# Patient Record
Sex: Male | Born: 1975 | Race: White | Hispanic: No | Marital: Married | Smoking: Never smoker
Health system: Southern US, Community
[De-identification: ages and names within clinical notes are randomized; demographics above are authoritative.]

## PROBLEM LIST (undated history)

## (undated) DIAGNOSIS — M25569 Pain in unspecified knee: Secondary | ICD-10-CM

## (undated) DIAGNOSIS — G4730 Sleep apnea, unspecified: Secondary | ICD-10-CM

## (undated) DIAGNOSIS — M2550 Pain in unspecified joint: Secondary | ICD-10-CM

## (undated) DIAGNOSIS — F32A Depression, unspecified: Secondary | ICD-10-CM

## (undated) DIAGNOSIS — F319 Bipolar disorder, unspecified: Secondary | ICD-10-CM

## (undated) DIAGNOSIS — E7800 Pure hypercholesterolemia, unspecified: Secondary | ICD-10-CM

## (undated) DIAGNOSIS — F329 Major depressive disorder, single episode, unspecified: Secondary | ICD-10-CM

## (undated) DIAGNOSIS — I10 Essential (primary) hypertension: Secondary | ICD-10-CM

## (undated) DIAGNOSIS — K5900 Constipation, unspecified: Secondary | ICD-10-CM

## (undated) HISTORY — PX: ADENOIDECTOMY: SUR15

## (undated) HISTORY — DX: Bipolar disorder, unspecified: F31.9

## (undated) HISTORY — DX: Sleep apnea, unspecified: G47.30

## (undated) HISTORY — DX: Constipation, unspecified: K59.00

## (undated) HISTORY — DX: Pain in unspecified joint: M25.50

## (undated) HISTORY — DX: Major depressive disorder, single episode, unspecified: F32.9

## (undated) HISTORY — DX: Depression, unspecified: F32.A

## (undated) HISTORY — DX: Pain in unspecified knee: M25.569

## (undated) HISTORY — PX: KNEE SURGERY: SHX244

---

## 2014-05-06 LAB — HEPATIC FUNCTION PANEL
ALT: 16 U/L (ref 10–40)
AST: 15 U/L (ref 14–40)
Alkaline Phosphatase: 28 U/L (ref 25–125)
BILIRUBIN, TOTAL: 0.5 mg/dL

## 2014-05-06 LAB — BASIC METABOLIC PANEL
BUN: 12 mg/dL (ref 4–21)
Creatinine: 0.8 mg/dL (ref <=1.3)
Glucose: 87 mg/dL
Potassium: 4.1 mmol/L (ref 3.4–5.3)
SODIUM: 140 mmol/L (ref 137–147)

## 2014-05-06 LAB — LIPID PANEL
Cholesterol: 165 mg/dL (ref 0–200)
HDL: 43 mg/dL (ref 35–70)
LDL CALC: 105 mg/dL
TRIGLYCERIDES: 87 mg/dL (ref 40–160)

## 2014-05-06 LAB — CBC AND DIFFERENTIAL
HEMATOCRIT: 41 % (ref 41–53)
HEMOGLOBIN: 14.2 g/dL (ref 13.5–17.5)
Platelets: 277 10*3/uL (ref 150–399)
WBC: 4.5 10^3/mL

## 2014-11-11 LAB — HEPATIC FUNCTION PANEL
ALT: 19 U/L (ref 10–40)
AST: 16 U/L (ref 14–40)
Alkaline Phosphatase: 28 U/L (ref 25–125)
Bilirubin, Total: 0.3 mg/dL

## 2014-11-11 LAB — LIPID PANEL
Cholesterol: 151 mg/dL (ref 0–200)
HDL: 41 mg/dL (ref 35–70)
LDL Cholesterol: 87 mg/dL
Triglycerides: 112 mg/dL (ref 40–160)

## 2014-11-11 LAB — BASIC METABOLIC PANEL
BUN: 11 mg/dL (ref 4–21)
CREATININE: 0.9 mg/dL (ref <=1.3)
GLUCOSE: 95 mg/dL
POTASSIUM: 4.3 mmol/L (ref 3.4–5.3)
Sodium: 141 mmol/L (ref 137–147)

## 2015-08-25 LAB — LIPID PANEL
Cholesterol: 169 mg/dL (ref 0–200)
HDL: 45 mg/dL (ref 35–70)
LDL Cholesterol: 93 mg/dL
TRIGLYCERIDES: 156 mg/dL (ref 40–160)

## 2015-08-25 LAB — HEPATIC FUNCTION PANEL
ALT: 17 U/L (ref 10–40)
AST: 15 U/L (ref 14–40)
Alkaline Phosphatase: 33 U/L (ref 25–125)
BILIRUBIN, TOTAL: 0.3 mg/dL

## 2015-08-25 LAB — CBC AND DIFFERENTIAL
HCT: 40 % — AB (ref 41–53)
Hemoglobin: 14 g/dL (ref 13.5–17.5)
Platelets: 303 10*3/uL (ref 150–399)
WBC: 5.6 10^3/mL

## 2015-08-25 LAB — BASIC METABOLIC PANEL
BUN: 11 mg/dL (ref 4–21)
CREATININE: 1.1 mg/dL (ref <=1.3)
Glucose: 97 mg/dL
Potassium: 4.1 mmol/L (ref 3.4–5.3)
SODIUM: 142 mmol/L (ref 137–147)

## 2015-12-11 DIAGNOSIS — F3181 Bipolar II disorder: Secondary | ICD-10-CM | POA: Diagnosis not present

## 2015-12-11 DIAGNOSIS — F900 Attention-deficit hyperactivity disorder, predominantly inattentive type: Secondary | ICD-10-CM | POA: Diagnosis not present

## 2015-12-11 DIAGNOSIS — F408 Other phobic anxiety disorders: Secondary | ICD-10-CM | POA: Diagnosis not present

## 2016-03-05 DIAGNOSIS — F408 Other phobic anxiety disorders: Secondary | ICD-10-CM | POA: Diagnosis not present

## 2016-03-05 DIAGNOSIS — F900 Attention-deficit hyperactivity disorder, predominantly inattentive type: Secondary | ICD-10-CM | POA: Diagnosis not present

## 2016-03-05 DIAGNOSIS — F3181 Bipolar II disorder: Secondary | ICD-10-CM | POA: Diagnosis not present

## 2016-06-11 ENCOUNTER — Ambulatory Visit (HOSPITAL_COMMUNITY)
Admission: EM | Admit: 2016-06-11 | Discharge: 2016-06-11 | Disposition: A | Discharge status: Home / Self Care | Payer: BLUE CROSS/BLUE SHIELD | Attending: Family Medicine | Admitting: Family Medicine

## 2016-06-11 ENCOUNTER — Encounter (HOSPITAL_COMMUNITY): Payer: Self-pay | Admitting: *Deleted

## 2016-06-11 DIAGNOSIS — L03032 Cellulitis of left toe: Secondary | ICD-10-CM

## 2016-06-11 HISTORY — DX: Essential (primary) hypertension: I10

## 2016-06-11 HISTORY — DX: Pure hypercholesterolemia, unspecified: E78.00

## 2016-06-11 MED ORDER — DOXYCYCLINE HYCLATE 100 MG PO TABS: 100.0000 mg | ORAL_TABLET | Freq: Two times a day (BID) | ORAL | 0 refills | Status: DC

## 2016-06-11 NOTE — ED Triage Notes (Signed)
Pain  l  Big  Toe  X  1  Week pus  X   3  Days       Pus  And  Redness

## 2016-06-11 NOTE — ED Provider Notes (Signed)
[facility]    CSN: [id number] Arrival date & time: 06/11/16  1004     History   Chief Complaint Chief Complaint  Patient presents with  . Toe Pain    HPI Corey Martin is a 40 y.o. male.   This is a 40 year old gentleman who was removing some tissue from his left great toe last weekend (5 days ago) after which he developed a large amount of pus and swelling and pain. There's been no discharge. Patient has not had this happen to him before.  Patient works as a Optician, dispensingminister.      Past Medical History:  Diagnosis Date  . Hypercholesteremia   . Hypertension     There are no active problems to display for this patient.   Past Surgical History:  Procedure Laterality Date  . KNEE SURGERY         Home Medications    Prior to Admission medications   Medication Sig Start Date End Date Taking? Authorizing Provider  AMLODIPINE BESYLATE PO Take by mouth.   Yes Historical Provider, MD  BuPROPion HCl (WELLBUTRIN SR PO) Take by mouth.   Yes Historical Provider, MD  FENOFIBRATE PO Take by mouth.   Yes Historical Provider, MD  LamoTRIgine (LAMICTAL PO) Take by mouth.   Yes Historical Provider, MD  LISINOPRIL PO Take by mouth.   Yes Historical Provider, MD  doxycycline (VIBRA-TABS) 100 MG tablet Take 1 tablet (100 mg total) by mouth 2 (two) times daily. 06/11/16   Elvina SidleKurt Horace Lukas, MD    Family History History reviewed. No pertinent family history.  Social History Social History  Substance Use Topics  . Smoking status: Never Smoker  . Smokeless tobacco: Never Used  . Alcohol use No     Allergies   Patient has no known allergies.   Review of Systems Review of Systems  Constitutional: Negative.   HENT: Negative.   Skin: Positive for wound.     Physical Exam Triage Vital Signs ED Triage Vitals [06/11/16 1033]  Enc Vitals Group     BP 139/78     Pulse Rate 79     Resp 16     Temp 98.1 F (36.7 C)     Temp Source Oral     SpO2 98 %     Weight        Height      Head Circumference      Peak Flow      Pain Score      Pain Loc      Pain Edu?      Excl. in GC?    No data found.   Updated Vital Signs BP 139/78 (BP Location: Left Arm)   Pulse 79   Temp 98.1 F (36.7 C) (Oral)   Resp 16   SpO2 98%    Physical Exam  Constitutional: He is oriented to person, place, and time. He appears well-developed and well-nourished.  HENT:  Head: Normocephalic.  Right Ear: External ear normal.  Left Ear: External ear normal.  Mouth/Throat: Oropharynx is clear and moist.  Eyes: Conjunctivae are normal. Pupils are equal, round, and reactive to light.  Neck: Normal range of motion. Neck supple.  Pulmonary/Chest: Effort normal.  Musculoskeletal: Normal range of motion.  Neurological: He is alert and oriented to person, place, and time.  Skin: Skin is warm. There is erythema.  Patient presented with a large paronychia on the tibial side of the left great toe cuticle. This was incised and  drained with no complication.  Nursing note and vitals reviewed.    UC Treatments / Results  Labs (all labs ordered are listed, but only abnormal results are displayed) Labs Reviewed - No data to display  EKG  EKG Interpretation None       Radiology No results found.  Procedures Procedures (including critical care time)  Medications Ordered in UC Medications - No data to display   Initial Impression / Assessment and Plan / UC Course  I have reviewed the triage vital signs and the nursing notes.  Pertinent labs & imaging results that were available during my care of the patient were reviewed by me and considered in my medical decision making (see chart for details).  Clinical Course     Final Clinical Impressions(s) / UC Diagnoses   Final diagnoses:  Paronychia of fifth toe of left foot    New Prescriptions New Prescriptions   DOXYCYCLINE (VIBRA-TABS) 100 MG TABLET    Take 1 tablet (100 mg total) by mouth 2 (two) times daily.      Elvina SidleKurt Salote Weidmann, MD 06/11/16 1048

## 2016-06-25 DIAGNOSIS — F3181 Bipolar II disorder: Secondary | ICD-10-CM | POA: Diagnosis not present

## 2016-06-25 DIAGNOSIS — F900 Attention-deficit hyperactivity disorder, predominantly inattentive type: Secondary | ICD-10-CM | POA: Diagnosis not present

## 2016-06-25 DIAGNOSIS — F408 Other phobic anxiety disorders: Secondary | ICD-10-CM | POA: Diagnosis not present

## 2016-07-29 ENCOUNTER — Ambulatory Visit (INDEPENDENT_AMBULATORY_CARE_PROVIDER_SITE_OTHER): Payer: BLUE CROSS/BLUE SHIELD | Admitting: Family Medicine

## 2016-07-29 ENCOUNTER — Encounter: Payer: Self-pay | Admitting: Family Medicine

## 2016-07-29 VITALS — BP 131/82 | HR 88 | Ht 70.25 in | Wt 320.2 lb

## 2016-07-29 DIAGNOSIS — I10 Essential (primary) hypertension: Secondary | ICD-10-CM

## 2016-07-29 DIAGNOSIS — M159 Polyosteoarthritis, unspecified: Secondary | ICD-10-CM | POA: Diagnosis not present

## 2016-07-29 DIAGNOSIS — Z87828 Personal history of other (healed) physical injury and trauma: Secondary | ICD-10-CM

## 2016-07-29 DIAGNOSIS — E781 Pure hyperglyceridemia: Secondary | ICD-10-CM | POA: Diagnosis not present

## 2016-07-29 DIAGNOSIS — E66813 Obesity, class 3: Secondary | ICD-10-CM

## 2016-07-29 NOTE — Patient Instructions (Signed)
Please realize, EXERCISE IS MEDICINE!  -  American Heart Association [facility]) guidelines for exercise : If you are in good health, without any medical conditions, you should engage in 150 minutes of moderate intensity aerobic activity per week.  This means you should be huffing and puffing throughout your workout.   Engaging in regular exercise will improve brain function and memory, as well as improve mood, boost immune system and help with weight management.  As well as the other, more well-known effects of exercise such as decreasing blood sugar levels, decreasing blood pressure,  and decreasing bad cholesterol levels/ increasing good cholesterol levels.     -  The AHA strongly endorses consumption of a diet that contains a variety of foods from all the food categories with an emphasis on fruits and vegetables; fat-free and low-fat dairy products; cereal and grain products; legumes and nuts; and fish, poultry, and/or extra lean meats.    Excessive food intake, especially of foods high in saturated and trans fats, sugar, and salt, should be avoided.    Adequate water intake of roughly 1/2 of your weight in pounds, should equal the ounces of water per day you should drink.  So for instance, if you're 200 pounds, that would be 100 ounces of water per day.      Guidelines for Losing Weight   We want weight loss that will last so you should lose 1-2 pounds a week.  THAT IS IT! Please pick THREE things a month to change. Once it is a habit check off the item. Then pick another three items off the list to become habits.  If you are already doing a habit on the list GREAT!  Cross that item off!  Don't drink your calories. Ie, alcohol, soda, fruit juice, and sweet tea.   Drink more water. Drink a glass when you feel hungry or before each meal.   Eat breakfast - Complex carb and protein (likeDannon light and fit yogurt, oatmeal, fruit, eggs, [place] bacon).  Measure your cereal.  Eat no more than one cup  a day. (ie Kashi)  Eat an apple a day.  Add a vegetable a day.  Try a new vegetable a month.  Use Pam! Stop using oil or butter to cook.  Don't finish your plate or use smaller plates.  Share your dessert.  Eat sugar free Jello for dessert or frozen grapes.  Don't eat 2-3 hours before bed.  Switch to whole wheat bread, pasta, and brown rice.  Make healthier choices when you eat out. No fries!  Pick baked chicken, NOT fried.  Don't forget to SLOW DOWN when you eat. It is not going anywhere.   Take the stairs.  Park far away in the parking lot  Lift soup cans (or weights) for 10 minutes while watching TV.  Walk at work for 10 minutes during break.  Walk outside 1 time a week with your friend, kids, dog, or significant other.  Start a walking group at church.  Walk the mall as much as you can tolerate.   Keep a food diary.  Weigh yourself daily.  Walk for 15 minutes 3 days per week.  Cook at home more often and eat out less. If life happens and you go back to old habits, it is okay.  Just start over. You can do it!  If you experience chest pain, get short of breath, or tired during the exercise, please stop immediately and inform your doctor.  Before you even begin to attack a weight-loss plan, it pays to remember this: You are not fat. You have fat. Losing weight isn't about blame or shame; it's simply another achievement to accomplish. Dieting is like any other skill-you have to buckle down and work at it. As long as you act in a smart, reasonable way, you'll ultimately get where you want to be. Here are some weight loss pearls for you.   1. It's Not a Diet. It's a Lifestyle Thinking of a diet as something you're on and suffering through only for the short term doesn't work. To shed weight and keep it off, you need to make permanent changes to the way you eat. It's OK to indulge occasionally, of course, but if you cut calories temporarily and then revert to  your old way of eating, you'll gain back the weight quicker than you can say yo-yo. Use it to lose it. Research shows that one of the best predictors of long-term weight loss is how many pounds you drop in the first month. For that reason, nutritionists often suggest being stricter for the first two weeks of your new eating strategy to build momentum. Cut out added sugar and alcohol and avoid unrefined carbs. After that, figure out how you can reincorporate them in a way that's healthy and maintainable.  2. There's a Right Way to Exercise Working out burns calories and fat and boosts your metabolism by building muscle. But those trying to lose weight are notorious for overestimating the number of calories they burn and underestimating the amount they take in. Unfortunately, your system is biologically programmed to hold on to extra pounds and that means when you start exercising, your body senses the deficit and ramps up its hunger signals. If you're not diligent, you'll eat everything you burn and then some. Use it, to lose it. Cardio gets all the exercise glory, but strength and interval training are the real heroes. They help you build lean muscle, which in turn increases your metabolism and calorie-burning ability 3. Don't Overreact to Mild Hunger Some people have a hard time losing weight because of hunger anxiety. To them, being hungry is bad-something to be avoided at all costs-so they carry snacks with them and eat when they don't need to. Others eat because they're stressed out or bored. While you never want to get to the point of being ravenous (that's when bingeing is likely to happen), a hunger pang, a craving, or the fact that it's 3:00 p.m. should not send you racing for the vending machine or obsessing about the energy bar in your purse. Ideally, you should put off eating until your stomach is growling and it's difficult to concentrate.  Use it to lose it. When you feel the urge to eat, use the  HALT method. Ask yourself, Am I really hungry? Or am I angry or anxious, lonely or bored, or tired? If you're still not certain, try the apple test. If you're truly hungry, an apple should seem delicious; if it doesn't, something else is going on. Or you can try drinking water and making yourself busy, if you are still hungry try a healthy snack.  4. Not All Calories Are Created Equal The mechanics of weight loss are pretty simple: Take in fewer calories than you use for energy. But the kind of food you eat makes all the difference. Processed food that's high in saturated fat and refined starch or sugar can cause inflammation that disrupts the hormone signals  that tell your brain you're full. The result: You eat a lot more.  Use it to lose it. Clean up your diet. Swap in whole, unprocessed foods, including vegetables, lean protein, and healthy fats that will fill you up and give you the biggest nutritional bang for your calorie buck. In a few weeks, as your brain starts receiving regular hunger and fullness signals once again, you'll notice that you feel less hungry overall and naturally start cutting back on the amount you eat.  5. Protein, Produce, and Plant-Based Fats Are Your Weight-Loss Trinity Here's why eating the three Ps regularly will help you drop pounds. Protein fills you up. You need it to build lean muscle, which keeps your metabolism humming so that you can torch more fat. People in a weight-loss program who ate double the recommended daily allowance for protein (about 110 grams for a 150-pound woman) lost 70 percent of their weight from fat, while people who ate the RDA lost only about 40 percent, one study found. Produce is packed with filling fiber. "It's very difficult to consume too many calories if you're eating a lot of vegetables. Example: Three cups of broccoli is a lot of food, yet only 93 calories. (Fruit is another story. It can be easy to overeat and can contain a lot of calories  from sugar, so be sure to monitor your intake.) Plant-based fats like olive oil and those in avocados and nuts are healthy and extra satiating.  Use it to lose it. Aim to incorporate each of the three Ps into every meal and snack. People who eat protein throughout the day are able to keep weight off, according to a study in the American Journal of Clinical Nutrition. In addition to meat, poultry and seafood, good sources are beans, lentils, eggs, tofu, and yogurt. As for fat, keep portion sizes in check by measuring out salad dressing, oil, and nut butters (shoot for one to two tablespoons). Finally, eat veggies or a little fruit at every meal. People who did that consumed 308 fewer calories but didn't feel any hungrier than when they didn't eat more produce.  7. How You Eat Is As Important As What You Eat In order for your brain to register that you're full, you need to focus on what you're eating. Sit down whenever you eat, preferably at a table. Turn off the TV or computer, put down your phone, and look at your food. Smell it. Chew slowly, and don't put another bite on your fork until you swallow. When women ate lunch this attentively, they consumed 30 percent less when snacking later than those who listened to an audiobook at lunchtime, according to a study in the [place] Journal of Nutrition. 8. Weighing Yourself Really Works The scale provides the best evidence about whether your efforts are paying off. Seeing the numbers tick up or down or stagnate is motivation to keep going-or to rethink your approach. A 2015 study at [facility] found that daily weigh-ins helped people lose more weight, keep it off, and maintain that loss, even after two years. Use it to lose it. Step on the scale at the same time every day for the best results. If your weight shoots up several pounds from one weigh-in to the next, don't freak out. Eating a lot of salt the night before or having your period is the likely  culprit. The number should return to normal in a day or two. It's a steady climb that you need to do  something about. 9. Too Much Stress and Too Little Sleep Are Your Enemies When you're tired and frazzled, your body cranks up the production of cortisol, the stress hormone that can cause carb cravings. Not getting enough sleep also boosts your levels of ghrelin, a hormone associated with hunger, while suppressing leptin, a hormone that signals fullness and satiety. People on a diet who slept only five and a half hours a night for two weeks lost 55 percent less fat and were hungrier than those who slept eight and a half hours, according to a study in the [place] [employer] Journal. Use it to lose it. Prioritize sleep, aiming for seven hours or more a night, which research shows helps lower stress. And make sure you're getting quality zzz's. If a snoring spouse or a fidgety cat wakes you up frequently throughout the night, you may end up getting the equivalent of just four hours of sleep, according to a study from [facility]. Keep pets out of the bedroom, and use a white-noise app to drown out snoring. 10. You Will Hit a plateau-And You Can Bust Through It As you slim down, your body releases much less leptin, the fullness hormone.  If you're not strength training, start right now. Building muscle can raise your metabolism to help you overcome a plateau. To keep your body challenged and burning calories, incorporate new moves and more intense intervals into your workouts or add another sweat session to your weekly routine. Alternatively, cut an extra 100 calories or so a day from your diet. Now that you've lost weight, your body simply doesn't need as much fuel.      Since food equals calories, in order to lose weight you must either eat fewer calories, exercise more to burn off calories with activity, or both. Food that is not used to fuel the body is stored as fat. A major component of  losing weight is to make smarter food choices. Here's how:  1)   Limit non-nutritious foods, such as: Sugar, honey, syrups and candy Pastries, donuts, pies, cakes and cookies Soft drinks, sweetened juices and alcoholic beverages  2)  Cut down on high-fat foods by: - Choosing poultry, fish or lean red meat - Choosing low-fat cooking methods, such as baking, broiling, steaming, grilling and boiling - Using low-fat or non-fat dairy products - Using vinaigrette, herbs, lemon or fat-free salad dressings - Avoiding fatty meats, such as bacon, sausage, franks, ribs and luncheon meats - Avoiding high-fat snacks like nuts, chips and chocolate - Avoiding fried foods - Using less butter, margarine, oil and mayonnaise - Avoiding high-fat gravies, cream sauces and cream-based soups  3) Eat a variety of foods, including: - Fruit and vegetables that are raw, steamed or baked - Whole grains, breads, cereal, rice and pasta - Dairy products, such as low-fat or non-fat milk or yogurt, low-fat cottage cheese and low-fat cheese - Protein-rich foods like chicken, [place], fish, lean meat and legumes, or beans  4) Change your eating habits by: - Eat three balanced meals a day to help control your hunger - Watch portion sizes and eat small servings of a variety of foods - Choose low-calorie snacks - Eat only when you are hungry and stop when you are satisfied - Eat slowly and try not to perform other tasks while eating - Find other activities to distract you from food, such as walking, taking up a hobby or being involved in the community - Include regular exercise in your daily  routine ( minimum of 20 min of moderate-intensity exercise at least 5 days/week)  - Find a support group, if necessary, for emotional support in your weight loss journey      Easy ways to cut 100 calories  1. Eat your eggs with hot sauce OR salsa instead of cheese.  Eggs are great for breakfast, but many people consider eggs  and cheese to be BFFs. Instead of cheese-1 oz. of cheddar has 114 calories-top your eggs with hot sauce, which contains no calories and helps with satiety and metabolism. Salsa is also a great option!!  2. Top your toast, waffles or pancakes with fresh berries instead of jelly or syrup. Half a cup of berries-fresh, frozen or thawed-has about 40 calories, compared with 2 tbsp. of maple syrup or jelly, which both have about 100 calories. The berries will also give you a good punch of fiber, which helps keep you full and satisfied and won't spike blood sugar quickly like the jelly or syrup. 3. Swap the non-fat latte for black coffee with a splash of half-and-half. Contrary to its name, that non-fat latte has 130 calories and a startling 19g of carbohydrates per 16 oz. serving. Replacing that 'light' drinkable dessert with a black coffee with a splash of half-and-half saves you more than 100 calories per 16 oz. serving. 4. Sprinkle salads with freeze-dried raspberries instead of dried cranberries. If you want a sweet addition to your nutritious salad, stay away from dried cranberries. They have a whopping 130 calories per  cup and 30g carbohydrates. Instead, sprinkle freeze-dried raspberries guilt-free and save more than 100 calories per  cup serving, adding 3g of belly-filling fiber. 5. Go for mustard in place of mayo on your sandwich. Mustard can add really nice flavor to any sandwich, and there are tons of varieties, from spicy to honey. A serving of mayo is 95 calories, versus 10 calories in a serving of mustard.  Or try an avocado mayo spread: You can find the recipe few click this link: https://www.californiaavocado.com/recipes/recipe-container/[place]-avocado-mayo 6. Choose a DIY salad dressing instead of the store-bought kind. Mix Dijon or whole grain mustard with low-fat Kefir or red wine vinegar and garlic. 7. Use hummus as a spread instead of a dip. Use hummus as a spread on a high-fiber  cracker or tortilla with a sandwich and save on calories without sacrificing taste. 8. Pick just one salad "accessory." Salad isn't automatically a calorie winner. It's easy to over-accessorize with toppings. Instead of topping your salad with nuts, avocado and cranberries (all three will clock in at 313 calories), just pick one. The next day, choose a different accessory, which will also keep your salad interesting. You don't wear all your jewelry every day, right? 9. Ditch the white pasta in favor of spaghetti squash. One cup of cooked spaghetti squash has about 40 calories, compared with traditional spaghetti, which comes with more than 200. Spaghetti squash is also nutrient-dense. It's a good source of fiber and Vitamins A and C, and it can be eaten just like you would eat pasta-with a great tomato sauce and [place] meatballs or with pesto, tofu and spinach, for example. 10. Dress up your chili, soups and stews with non-fat [place] yogurt instead of sour cream. Just a 'dollop' of sour cream can set you back 115 calories and a whopping 12g of fat-seven of which are of the artery-clogging variety. Added bonus: [place] yogurt is packed with muscle-building protein, calcium and B Vitamins. 11. Mash cauliflower instead of mashed potatoes.  One cup of traditional mashed potatoes-in all their creamy goodness-has more than 200 calories, compared to mashed cauliflower, which you can typically eat for less than 100 calories per 1 cup serving. Cauliflower is a great source of the antioxidant indole-3-carbinol (I3C), which may help reduce the risk of some cancers, like breast cancer. 12. Ditch the ice cream sundae in favor of a [place] yogurt parfait. Instead of a cup of ice cream or fro-yo for dessert, try 1 cup of nonfat Greek yogurt topped with fresh berries and a sprinkle of cacao nibs. Both toppings are packed with antioxidants, which can help reduce cellular inflammation and oxidative damage. And the comparison is a  no-brainer: One cup of ice cream has about 275 calories; one cup of frozen yogurt has about 230; and a cup of Greek yogurt has just 130, plus twice the protein, so you're less likely to return to the freezer for a second helping. 13. Put olive oil in a spray container instead of using it directly from the bottle. Each tablespoon of olive oil is 120 calories and 15g of fat. Use a mister instead of pouring it straight into the pan or onto a salad. This allows for portion control and will save you more than 100 calories. 14. When baking, substitute canned pumpkin for butter or oil. Canned pumpkin-not pumpkin pie mix-is loaded with Vitamin A, which is important for skin and eye health, as well as immunity. And the comparisons are pretty crazy:  cup of canned pumpkin has about 40 calories, compared to butter or oil, which has more than 800 calories. Yes, 800 calories. Applesauce and mashed banana can also serve as good substitutions for butter or oil, usually in a 1:1 ratio. 15. Top casseroles with high-fiber cereal instead of breadcrumbs. Breadcrumbs are typically made with white bread, while breakfast cereals contain 5-9g of fiber per serving. Not only will you save more than 150 calories per  cup serving, the swap will also keep you more full and you'll get a metabolism boost from the added fiber. 16. Snack on pistachios instead of macadamia nuts. Believe it or not, you get the same amount of calories from 35 pistachios (100 calories) as you would from only five macadamia nuts. 17. Chow down on kale chips rather than potato chips. This is my favorite 'don't knock it 'till you try it' swap. Kale chips are so easy to make at home, and you can spice them up with a little grated parmesan or chili powder. Plus, they're a mere fraction of the calories of potato chips, but with the same crunch factor we crave so often. 18. Add seltzer and some fruit slices to your cocktail instead of soda or fruit juice. One  cup of soda or fruit juice can pack on as much as 140 calories. Instead, use seltzer and fruit slices. The fruit provides valuable phytochemicals, such as flavonoids and anthocyanins, which help to combat cancer and stave off the aging process.      Phentermine  While taking the medication we may ask that you come into the office once a month or once every 2-3 months to monitor your weight, blood pressure, and heart rate. In addition we can help answer your questions about diet, exercise, and help you every step of the way with your weight loss journey. Sometime it is helpful if you bring in a food diary or use an app on your phone such as myfitnesspal to record your calorie intake, especially in the beginning.  You can start out on 1/3 to 1/2 a pill in the morning and if you are tolerating it well you can increase to one pill daily. I also have some patients that take 1/3 or 1/2 at lunch to help prevent night time eating.  [facility] is 14-17 dollars and you do NOT need a membership to get meds from there.    What is this medicine? PHENTERMINE (FEN ter meen) decreases your appetite. This medicine is intended to be used in addition to a healthy reduced calorie diet and exercise. The best results are achieved this way. This medicine is only indicated for short-term use. Eventually your weight loss may level out and the medication will no longer be needed.   How should I use this medicine? Take this medicine by mouth. Follow the directions on the prescription label. The tablets should stay in the bottle until immediately before you take your dose. Take your doses at regular intervals. Do not take your medicine more often than directed.  Overdosage: If you think you have taken too much of this medicine contact a poison control center or emergency room at once. NOTE: This medicine is only for you. Do not share this medicine with  others.  What if I miss a dose? If you miss a dose, take it as soon as you can. If it is almost time for your next dose, take only that dose. Do not take double or extra doses. Do not increase or in any way change your dose without consulting your doctor.  What should I watch for while using this medicine? Notify your physician immediately if you become short of breath while doing your normal activities. Do not take this medicine within 6 hours of bedtime. It can keep you from getting to sleep. Avoid drinks that contain caffeine and try to stick to a regular bedtime every night. Do not stand or sit up quickly, especially if you are an older patient. This reduces the risk of dizzy or fainting spells. Avoid alcoholic drinks.  What side effects may I notice from receiving this medicine? Side effects that you should report to your doctor or health care professional as soon as possible: -chest pain, palpitations -depression or severe changes in mood -increased blood pressure -irritability -nervousness or restlessness -severe dizziness -shortness of breath -problems urinating -unusual swelling of the legs -vomiting  Side effects that usually do not require medical attention (report to your doctor or health care professional if they continue or are bothersome): -blurred vision or other eye problems -changes in sexual ability or desire -constipation or diarrhea -difficulty sleeping -dry mouth or unpleasant taste -headache -nausea This list may not describe all possible side effects. Call your doctor for medical advice about side effects. You may report side effects to FDA at 1-800-FDA-1088.

## 2016-07-29 NOTE — Progress Notes (Signed)
New patient office visit note:  Impression and Recommendations:    1. Essential hypertension   2. Obesity, Class III, BMI 40-49.9 (morbid obesity) (HCC)   3. Hypertriglyceridemia   4. Generalized OA   5. History of meniscal tear- L knee    1. bp good control- cont meds; lifestyle mod d.c pt; wt loss and exericse.  DASH diet. 2. Nutrition referral; counseling done and pt needs strict, set guidelines and I rec [employer] or counselor 3. Obtain fasting labs near future- routine ones 4. Wt loss would be definitive txmnt; declines bariatric c/s 5. f/up ortho prn; ice; avoid aggravating activities 6. See pt handouts discussed and printed for pt  Orders Placed This Encounter  Procedures  . Amb ref to Medical Nutrition Therapy-MNT    Patient's Medications  New Prescriptions   No medications on file  Previous Medications   AMLODIPINE (NORVASC) 5 MG TABLET    Take 5 mg by mouth daily.   LAMOTRIGINE (LAMICTAL PO)    Take 100 mg by mouth daily.   Modified Medications   Modified Medication Previous Medication   BUPROPION (WELLBUTRIN XL) 300 MG 24 HR TABLET buPROPion (WELLBUTRIN XL) 300 MG 24 hr tablet      TAKE 1 TABLET (300 MG TOTAL) BY MOUTH DAILY.    Take 1 tablet (300 mg total) by mouth daily.   CHOLINE FENOFIBRATE (FENOFIBRIC ACID) 135 MG CPDR Choline Fenofibrate (FENOFIBRIC ACID) 135 MG CPDR      TAKE ONE CAPSULE EVERY DAY    Take 135 mg by mouth daily.   LISINOPRIL-HYDROCHLOROTHIAZIDE (PRINZIDE,ZESTORETIC) 20-12.5 MG TABLET lisinopril-hydrochlorothiazide (PRINZIDE,ZESTORETIC) 20-12.5 MG tablet      TAKE 1 TABLET BY MOUTH DAILY.    Take 1 tablet by mouth daily.   VITAMIN D, ERGOCALCIFEROL, (DRISDOL) 50000 UNITS CAPS CAPSULE Vitamin D, Ergocalciferol, (DRISDOL) 50000 units CAPS capsule      TAKE 1 CAPSULE (50,000 UNITS TOTAL) BY MOUTH EVERY 7 (SEVEN) DAYS.    Take 1 capsule (50,000 Units total) by mouth every 7 (seven) days.  Discontinued Medications   AMLODIPINE BESYLATE PO    Take 1  tablet by mouth 2 (two) times daily.    BUPROPION (WELLBUTRIN XL) 300 MG 24 HR TABLET    Take 300 mg by mouth daily.   BUPROPION HCL (WELLBUTRIN SR PO)    Take by mouth.   DOXYCYCLINE (VIBRA-TABS) 100 MG TABLET    Take 1 tablet (100 mg total) by mouth 2 (two) times daily.   FENOFIBRATE PO    Take 135 mg by mouth daily.    LISINOPRIL PO    Take by mouth.   LISINOPRIL-HYDROCHLOROTHIAZIDE (PRINZIDE,ZESTORETIC) 20-12.5 MG TABLET    Take 1 tablet by mouth daily.    The patient was counseled, risk factors were discussed, anticipatory guidance given.  Gross side effects, risk and benefits, and alternatives of medications discussed with patient.  Patient is aware that all medications have potential side effects and we are unable to predict every side effect or drug-drug interaction that may occur.  Expresses verbal understanding and consents to current therapy plan and treatment regimen.   Return for FBW- near future and then OV in 4months- HTN, obesity, HLD/TG.   Please see AVS handed out to patient at the end of our visit for further patient instructions/ counseling done pertaining to today's office visit.    Note: This document was prepared using Dragon voice recognition software and may include unintentional dictation errors.  ----------------------------------------------------------------------------------------------------------------------  Subjective:    Chief complaint:   Chief Complaint  Patient presents with  . Establish Care     HPI: Corey Martin is a pleasant 41 y.o. male who presents to [facility] today to review their medical history with me and establish care.   I asked the patient to review their chronic problem list with me to ensure everything was updated and accurate.    All recent office visits with other providers, any medical records that patient brought in etc  - I reviewed today.     Also asked pt to get me medical records from  [facility] providers/ specialists that they had seen within the past 3-5 years- if they are in private practice and/or do not work for a [employer], [facility], [facility], [facility] or [facility] owned practice.  Told them to call their specialists to clarify this if they are not sure.     -  Moved from [place]---> TO here in May 2017---> has not seen doc since.  Gained 20lbs in past 6 mo.    --->  [employer]---> 14 yrs.  From [place].  12 child; wife Elon Jester.  3yo girl.   HTN:  6 yrs- liniiopril amd amlodi[pine  TG- fenofibrate":  ldl was always good.   Mood Bi-polar:   wellbutrin.and lamictal-   Has psychiatrist here in [place] at [facility] in [place].  Doesn't of name.    Sanda Linger NP there under Dr Quintella Reichert.   Cam Hines- therapist.      OA---> L knee.  And with increasing wt altely causes more knoww pain.  Meniscal tear--repeaired in 2015- L knee in [place].   Obesity --   Struggled most of his life.  Genetically predisposed- both parents obese.   Sister- struggles  -- 2014---> lost 80lbs-- w/  Low 285 with a walking program.      No pre-diabetes or glucose intol     Wt Readings from Last 3 Encounters:  09/03/16 (!) 315 lb 1.6 oz (142.9 kg)  07/29/16 (!) 320 lb 3.2 oz (145.2 kg)   BP Readings from Last 3 Encounters:  09/03/16 127/80  07/29/16 131/82  06/11/16 139/78   Pulse Readings from Last 3 Encounters:  09/03/16 (!) 114  07/29/16 88  06/11/16 79   BMI Readings from Last 3 Encounters:  09/03/16 44.89 kg/m  07/29/16 45.62 kg/m    Patient Care Team    Relationship Specialty Notifications Start End  [name], DO PCP - General Family Medicine  07/29/16     Patient Active Problem List   Diagnosis Date Noted  . HTN (hypertension) 07/29/2016    Priority: High  . Obesity, Class III, BMI 40-49.9 (morbid obesity) (HCC) 07/29/2016    Priority: High  . Hypertriglyceridemia 07/29/2016    Priority: High  . Generalized OA- knee  07/29/2016    Priority: Medium  . Influenza A 09/03/2016  . History of meniscal tear- L knee 07/29/2016     Past Medical History:  Diagnosis Date  . Depression   . Hypercholesteremia   . Hypertension      Past Medical History:  Diagnosis Date  . Depression   . Hypercholesteremia   . Hypertension      Past Surgical History:  Procedure Laterality Date  . KNEE SURGERY       Family History  Problem Relation Age of Onset  . Hyperlipidemia Mother   . Hypertension Mother   .  Heart attack Father   . Hyperlipidemia Father   . Hypertension Father   . Diabetes Maternal Grandmother      History  Drug Use No     History  Alcohol Use  . 1.8 oz/week  . 1 Glasses of wine, 1 Cans of beer, 1 Shots of liquor per week     History  Smoking Status  . Never Smoker  Smokeless Tobacco  . Never Used     Outpatient Encounter Prescriptions as of 07/29/2016  Medication Sig  . amLODipine (NORVASC) 5 MG tablet Take 5 mg by mouth daily.  . LamoTRIgine (LAMICTAL PO) Take 100 mg by mouth daily.   . [DISCONTINUED] buPROPion (WELLBUTRIN XL) 300 MG 24 hr tablet Take 300 mg by mouth daily.  . [DISCONTINUED] Choline Fenofibrate (FENOFIBRIC ACID) 135 MG CPDR Take 135 mg by mouth daily.  . [DISCONTINUED] lisinopril-hydrochlorothiazide (PRINZIDE,ZESTORETIC) 20-12.5 MG tablet Take 1 tablet by mouth daily.  . [DISCONTINUED] AMLODIPINE BESYLATE PO Take 1 tablet by mouth 2 (two) times daily.   . [DISCONTINUED] BuPROPion HCl (WELLBUTRIN SR PO) Take by mouth.  . [DISCONTINUED] doxycycline (VIBRA-TABS) 100 MG tablet Take 1 tablet (100 mg total) by mouth 2 (two) times daily.  . [DISCONTINUED] FENOFIBRATE PO Take 135 mg by mouth daily.   . [DISCONTINUED] LISINOPRIL PO Take by mouth.   No facility-administered encounter medications on file as of 07/29/2016.     Allergies: Patient has no known allergies.   Review of Systems  Constitutional: Negative for chills, diaphoresis, fever,  malaise/fatigue and weight loss.  HENT: Negative for congestion, sore throat and tinnitus.   Eyes: Negative for blurred vision, double vision and photophobia.  Respiratory: Negative for cough and wheezing.   Cardiovascular: Negative for chest pain and palpitations.  Gastrointestinal: Negative for blood in stool, diarrhea, nausea and vomiting.  Genitourinary: Negative for dysuria, frequency and urgency.  Musculoskeletal: Negative for joint pain and myalgias.  Skin: Negative for itching and rash.  Neurological: Negative for dizziness, weakness and headaches.  Endo/Heme/Allergies: Negative for environmental allergies and polydipsia. Does not bruise/bleed easily.  Psychiatric/Behavioral: Negative for depression and memory loss. The patient is not nervous/anxious and does not have insomnia.      Objective:   Blood pressure 131/82, pulse 88, height 5' 10.25" (1.784 m), weight (!) 320 lb 3.2 oz (145.2 kg), SpO2 96 %. Body mass index is 45.62 kg/m. General: Well Developed, well nourished, and in no acute distress.  Neuro: Alert and oriented x3, extra-ocular muscles intact, sensation grossly intact.  HEENT:[place]/AT, PERRLA, neck supple, No carotid bruits Skin: no gross rashes  Cardiac: Regular rate and rhythm Respiratory: Essentially clear to auscultation bilaterally. Not using accessory muscles, speaking in full sentences.  Abdominal: not grossly distended Musculoskeletal: Ambulates w/o diff, FROM * 4 ext.  Vasc: less 2 sec cap RF, warm and pink  Psych:  No HI/SI, judgement and insight good, Euthymic mood. Full Affect.

## 2016-08-02 ENCOUNTER — Other Ambulatory Visit: Payer: Self-pay

## 2016-08-02 ENCOUNTER — Other Ambulatory Visit (INDEPENDENT_AMBULATORY_CARE_PROVIDER_SITE_OTHER): Payer: BLUE CROSS/BLUE SHIELD

## 2016-08-02 DIAGNOSIS — I10 Essential (primary) hypertension: Secondary | ICD-10-CM

## 2016-08-02 DIAGNOSIS — E784 Other hyperlipidemia: Secondary | ICD-10-CM

## 2016-08-02 DIAGNOSIS — Z1329 Encounter for screening for other suspected endocrine disorder: Secondary | ICD-10-CM

## 2016-08-02 DIAGNOSIS — E559 Vitamin D deficiency, unspecified: Secondary | ICD-10-CM | POA: Diagnosis not present

## 2016-08-02 DIAGNOSIS — E7849 Other hyperlipidemia: Secondary | ICD-10-CM

## 2016-08-02 DIAGNOSIS — Z1321 Encounter for screening for nutritional disorder: Secondary | ICD-10-CM

## 2016-08-02 DIAGNOSIS — Z131 Encounter for screening for diabetes mellitus: Secondary | ICD-10-CM

## 2016-08-03 LAB — CBC WITH DIFFERENTIAL/PLATELET
Basophils Absolute: 0 10*3/uL (ref 0.0–0.2)
Basos: 0 %
EOS (ABSOLUTE): 0.2 10*3/uL (ref 0.0–0.4)
Eos: 4 %
Hematocrit: 43.8 % (ref 37.5–51.0)
Hemoglobin: 14.9 g/dL (ref 13.0–17.7)
IMMATURE GRANULOCYTES: 0 %
Immature Grans (Abs): 0 10*3/uL (ref 0.0–0.1)
Lymphocytes Absolute: 2 10*3/uL (ref 0.7–3.1)
Lymphs: 37 %
MCH: 30.4 pg (ref 26.6–33.0)
MCHC: 34 g/dL (ref 31.5–35.7)
MCV: 89 fL (ref 79–97)
Monocytes Absolute: 0.5 10*3/uL (ref 0.1–0.9)
Monocytes: 10 %
NEUTROS PCT: 49 %
Neutrophils Absolute: 2.6 10*3/uL (ref 1.4–7.0)
PLATELETS: 370 10*3/uL (ref 150–379)
RBC: 4.9 x10E6/uL (ref 4.14–5.80)
RDW: 13 % (ref 12.3–15.4)
WBC: 5.4 10*3/uL (ref 3.4–10.8)

## 2016-08-03 LAB — LIPID PANEL
CHOLESTEROL TOTAL: 189 mg/dL (ref 100–199)
Chol/HDL Ratio: 3.9 ratio units (ref 0.0–5.0)
HDL: 48 mg/dL (ref >39)
LDL CALC: 118 mg/dL — AB (ref 0–99)
TRIGLYCERIDES: 113 mg/dL (ref 0–149)
VLDL CHOLESTEROL CAL: 23 mg/dL (ref 5–40)

## 2016-08-03 LAB — COMPREHENSIVE METABOLIC PANEL
A/G RATIO: 1.9 (ref 1.2–2.2)
ALK PHOS: 33 IU/L — AB (ref 39–117)
ALT: 31 IU/L (ref 0–44)
AST: 18 IU/L (ref 0–40)
Albumin: 4.6 g/dL (ref 3.5–5.5)
BILIRUBIN TOTAL: 0.4 mg/dL (ref 0.0–1.2)
BUN/Creatinine Ratio: 20 (ref 9–20)
BUN: 16 mg/dL (ref 6–20)
CHLORIDE: 102 mmol/L (ref 96–106)
CO2: 21 mmol/L (ref 18–29)
Calcium: 9.6 mg/dL (ref 8.7–10.2)
Creatinine, Ser: 0.82 mg/dL (ref 0.76–1.27)
GFR calc Af Amer: 130 mL/min/{1.73_m2} (ref >59)
GFR calc non Af Amer: 112 mL/min/{1.73_m2} (ref >59)
GLOBULIN, TOTAL: 2.4 g/dL (ref 1.5–4.5)
Glucose: 90 mg/dL (ref 65–99)
POTASSIUM: 4.4 mmol/L (ref 3.5–5.2)
SODIUM: 141 mmol/L (ref 134–144)
Total Protein: 7 g/dL (ref 6.0–8.5)

## 2016-08-03 LAB — VITAMIN D 25 HYDROXY (VIT D DEFICIENCY, FRACTURES): Vit D, 25-Hydroxy: 16.8 ng/mL — ABNORMAL LOW (ref 30.0–100.0)

## 2016-08-03 LAB — HEMOGLOBIN A1C
Est. average glucose Bld gHb Est-mCnc: 103 mg/dL
Hgb A1c MFr Bld: 5.2 % (ref 4.8–5.6)

## 2016-08-03 LAB — TSH: TSH: 2.28 u[IU]/mL (ref 0.450–4.500)

## 2016-08-09 ENCOUNTER — Telehealth: Payer: Self-pay | Admitting: Family Medicine

## 2016-08-09 ENCOUNTER — Other Ambulatory Visit: Payer: Self-pay

## 2016-08-09 MED ORDER — LISINOPRIL-HYDROCHLOROTHIAZIDE 20-12.5 MG PO TABS: 1.0000 | ORAL_TABLET | Freq: Every day | ORAL | 0 refills | Status: DC

## 2016-08-09 MED ORDER — VITAMIN D (ERGOCALCIFEROL) 1.25 MG (50000 UNIT) PO CAPS: 50000.0000 [IU] | ORAL_CAPSULE | ORAL | 0 refills | Status: DC

## 2016-08-09 MED ORDER — BUPROPION HCL ER (XL) 300 MG PO TB24: 300.0000 mg | ORAL_TABLET | Freq: Every day | ORAL | 0 refills | Status: DC

## 2016-08-09 NOTE — Telephone Encounter (Signed)
Patient called back late Friday to return a nurse call on lab results, would like a call back

## 2016-08-09 NOTE — Telephone Encounter (Signed)
Patient called back late Friday to return a nurse call on lab results, would like a call back °

## 2016-08-09 NOTE — Telephone Encounter (Signed)
Pt informed of results.  Pt expressed understanding and is agreeable.  T. Nelson, CMA 

## 2016-09-03 ENCOUNTER — Ambulatory Visit (INDEPENDENT_AMBULATORY_CARE_PROVIDER_SITE_OTHER): Payer: BLUE CROSS/BLUE SHIELD | Admitting: Adult Health

## 2016-09-03 ENCOUNTER — Encounter: Payer: Self-pay | Admitting: Adult Health

## 2016-09-03 VITALS — BP 127/80 | HR 114 | Temp 101.2°F | Ht 70.25 in | Wt 315.1 lb

## 2016-09-03 DIAGNOSIS — R6883 Chills (without fever): Secondary | ICD-10-CM

## 2016-09-03 DIAGNOSIS — R52 Pain, unspecified: Secondary | ICD-10-CM | POA: Diagnosis not present

## 2016-09-03 DIAGNOSIS — J101 Influenza due to other identified influenza virus with other respiratory manifestations: Secondary | ICD-10-CM | POA: Diagnosis not present

## 2016-09-03 DIAGNOSIS — R509 Fever, unspecified: Secondary | ICD-10-CM | POA: Diagnosis not present

## 2016-09-03 LAB — POCT INFLUENZA A/B
Influenza A, POC: POSITIVE — AB
Influenza B, POC: NEGATIVE

## 2016-09-03 MED ORDER — OSELTAMIVIR PHOSPHATE 75 MG PO CAPS: 75.0000 mg | ORAL_CAPSULE | Freq: Two times a day (BID) | ORAL | 0 refills | Status: AC

## 2016-09-03 NOTE — Patient Instructions (Signed)

## 2016-09-03 NOTE — Assessment & Plan Note (Signed)
Tamiful 75mg  PO BID x 5 d Increase/vit c/rest. Alternate OTC Acetaminophen and OTC Ibuprofen. Remain home until afebrile for at least 24hrs without anti-pyretic. HAND WASHING!

## 2016-09-03 NOTE — Progress Notes (Signed)
Subjective:    Patient ID: Corey Martin, male    DOB: 10-07-1975, 41 y.o.   MRN: [id number]  HPI :  Mr. Derrill KayGoodman presents with fever/body aches/fatigue/night sweats that began yesterday morning.  He has been taking OTC cold remedies, last dose was yesterday.  He denies CP/dyspnea/N/V/D.   Patient Care Team    Relationship Specialty Notifications Start End  Thomasene Loteborah Opalski, DO PCP - General Family Medicine  07/29/16     Patient Active Problem List   Diagnosis Date Noted  . Influenza A 09/03/2016  . HTN (hypertension) 07/29/2016  . Obesity, Class III, BMI 40-49.9 (morbid obesity) (HCC) 07/29/2016  . Hypertriglyceridemia 07/29/2016  . Generalized OA- knee 07/29/2016  . History of meniscal tear- L knee 07/29/2016     Past Medical History:  Diagnosis Date  . Depression   . Hypercholesteremia   . Hypertension      Past Surgical History:  Procedure Laterality Date  . KNEE SURGERY       Family History  Problem Relation Age of Onset  . Hyperlipidemia Mother   . Hypertension Mother   . Heart attack Father   . Hyperlipidemia Father   . Hypertension Father   . Diabetes Maternal Grandmother      History  Drug Use No     History  Alcohol Use  . 1.8 oz/week  . 1 Glasses of wine, 1 Cans of beer, 1 Shots of liquor per week     History  Smoking Status  . Never Smoker  Smokeless Tobacco  . Never Used     Outpatient Encounter Prescriptions as of 09/03/2016  Medication Sig  . amLODipine (NORVASC) 5 MG tablet Take 5 mg by mouth daily.  Marland Kitchen. buPROPion (WELLBUTRIN XL) 300 MG 24 hr tablet Take 1 tablet (300 mg total) by mouth daily.  . Choline Fenofibrate (FENOFIBRIC ACID) 135 MG CPDR Take 135 mg by mouth daily.  . LamoTRIgine (LAMICTAL PO) Take 100 mg by mouth daily.   Marland Kitchen. lisinopril-hydrochlorothiazide (PRINZIDE,ZESTORETIC) 20-12.5 MG tablet Take 1 tablet by mouth daily.  . Vitamin D, Ergocalciferol, (DRISDOL) 50000 units CAPS capsule Take 1 capsule (50,000 Units total) by  mouth every 7 (seven) days.  Marland Kitchen. oseltamivir (TAMIFLU) 75 MG capsule Take 1 capsule (75 mg total) by mouth 2 (two) times daily.   No facility-administered encounter medications on file as of 09/03/2016.     Allergies: Patient has no known allergies.  Body mass index is 44.89 kg/m.  Blood pressure 127/80, pulse (!) 114, temperature (!) 101.2 F (38.4 C), temperature source Oral, height 5' 10.25" (1.784 m), weight (!) 315 lb 1.6 oz (142.9 kg).    Review of Systems  Constitutional: Positive for activity change, appetite change, chills, diaphoresis, fatigue and fever. Negative for unexpected weight change.  HENT: Positive for congestion, postnasal drip, sneezing and sore throat. Negative for trouble swallowing and voice change.   Eyes: Negative for visual disturbance.  Respiratory: Positive for cough. Negative for shortness of breath and wheezing.   Cardiovascular: Negative for chest pain and leg swelling.  Gastrointestinal: Negative for abdominal distention, abdominal pain, diarrhea and nausea.  Endocrine: Negative for cold intolerance, heat intolerance, polydipsia, polyphagia and polyuria.  Genitourinary: Negative for difficulty urinating.  Skin: Negative for color change, pallor and rash.  Neurological: Negative for dizziness.       Objective:   Physical Exam  Constitutional: He is oriented to person, place, and time. He appears well-developed and well-nourished. He appears distressed.  HENT:  Head: Normocephalic  and atraumatic.  Eyes: Conjunctivae and EOM are normal. Pupils are equal, round, and reactive to light.  Neck: Normal range of motion. Neck supple. No tracheal deviation present. No thyromegaly present.  Cardiovascular: Normal rate, regular rhythm, normal heart sounds and intact distal pulses.   Pulmonary/Chest: Effort normal and breath sounds normal. No respiratory distress. He has no wheezes.  Abdominal: Soft. Bowel sounds are normal.  Lymphadenopathy:    He has no  cervical adenopathy.  Neurological: He is alert and oriented to person, place, and time. He has normal reflexes.  Skin: Skin is warm. No rash noted. He is diaphoretic. No pallor.  Diaphoretic, flushed  Psychiatric: He has a normal mood and affect. His behavior is normal. Judgment and thought content normal.  Nursing note and vitals reviewed.         Assessment & Plan:   1. Fever, unspecified fever cause   2. Chills   3. Body aches   4. Influenza A     Influenza A Tamiful 75mg  PO BID x 5 d Increase/vit c/rest. Alternate OTC Acetaminophen and OTC Ibuprofen. Remain home until afebrile for at least 24hrs without anti-pyretic. HAND WASHING!    FOLLOW-UP:  Return if symptoms worsen or fail to improve.

## 2016-09-06 ENCOUNTER — Telehealth: Payer: Self-pay | Admitting: Family Medicine

## 2016-09-06 NOTE — Telephone Encounter (Signed)
Pt's spouse states that pt is having a lot of yellow/green nasal discharge.  She states that he no longer has a fever or body aches.  Since pt has HTN, advised Coricidin HBP as directed, ibuprofen for pain, saline nasal rinses using distilled water, and to keep hydrated.  Pt or spouse to call if fevers return or no improvement by 09/09/16.  Spouse expressed understanding and is agreeable.  Tiajuana Amass. Clarice Zulauf, CMA

## 2016-09-06 NOTE — Telephone Encounter (Signed)
Pt's wife called states patient is still having congestion issue stemming from the flu-- complaining of sinus drainage & facial pain--please call to advise what can be done to relieve the sinus pressure

## 2016-09-06 NOTE — Telephone Encounter (Signed)
Spoke with pt who states that he is not sure what his wife was calling for an he will have her call back to speak with me.  Corey Martin. Nelson, CMA

## 2016-09-23 ENCOUNTER — Other Ambulatory Visit: Payer: Self-pay | Admitting: Family Medicine

## 2016-09-23 ENCOUNTER — Telehealth: Payer: Self-pay | Admitting: Family Medicine

## 2016-09-23 NOTE — Telephone Encounter (Signed)
Patient is requesting a refill of the fenofibric acid 135mg 

## 2016-09-23 NOTE — Telephone Encounter (Signed)
Refill sent to pharmacy.  T. Nelson, CMA 

## 2016-10-08 DIAGNOSIS — F900 Attention-deficit hyperactivity disorder, predominantly inattentive type: Secondary | ICD-10-CM | POA: Diagnosis not present

## 2016-10-08 DIAGNOSIS — F408 Other phobic anxiety disorders: Secondary | ICD-10-CM | POA: Diagnosis not present

## 2016-10-08 DIAGNOSIS — F3181 Bipolar II disorder: Secondary | ICD-10-CM | POA: Diagnosis not present

## 2016-11-09 ENCOUNTER — Other Ambulatory Visit: Payer: Self-pay | Admitting: Family Medicine

## 2016-11-15 ENCOUNTER — Other Ambulatory Visit: Payer: Self-pay | Admitting: Family Medicine

## 2016-11-18 ENCOUNTER — Other Ambulatory Visit: Payer: Self-pay | Admitting: Family Medicine

## 2016-12-09 ENCOUNTER — Other Ambulatory Visit: Payer: Self-pay | Admitting: Family Medicine

## 2017-01-04 ENCOUNTER — Telehealth: Payer: Self-pay | Admitting: Family Medicine

## 2017-01-04 DIAGNOSIS — E559 Vitamin D deficiency, unspecified: Secondary | ICD-10-CM | POA: Insufficient documentation

## 2017-01-04 DIAGNOSIS — I159 Secondary hypertension, unspecified: Secondary | ICD-10-CM

## 2017-01-04 DIAGNOSIS — E781 Pure hyperglyceridemia: Secondary | ICD-10-CM

## 2017-01-04 MED ORDER — AMLODIPINE BESYLATE 5 MG PO TABS: 5.0000 mg | ORAL_TABLET | Freq: Every day | ORAL | 0 refills | Status: DC

## 2017-01-04 NOTE — Telephone Encounter (Signed)
Patient is requesting a refill of his amlodipine sent to [facility] [address]. They are going out of town tomorrow and realized he will be out while out of town and would like to pick it up today if at all possible.

## 2017-01-04 NOTE — Telephone Encounter (Signed)
Amlodipine refilled.  Follow up appointments made.  Patient aware.

## 2017-01-04 NOTE — Addendum Note (Signed)
Addended by: Judd GaudierLEVENS, Mckinzey Entwistle M on: 01/04/2017 10:05 AM   Modules accepted: Orders

## 2017-01-05 ENCOUNTER — Telehealth: Payer: Self-pay | Admitting: Family Medicine

## 2017-01-05 NOTE — Telephone Encounter (Signed)
Per medical records at his first office visit with us on 07/29/2016 patient reported 1 tablet 5 mg amlodipine daily.  That medicine was just continued.

## 2017-01-05 NOTE — Telephone Encounter (Signed)
Wife called to extend thanks for the quick response on her husbands amlodipine prescription. She did have one concern, she states that Corey Martin has been taking one 5mg  tab TWICE a day, but the prescription they got yesterday stated ONE 5mg  per day. She wants to speak with someone clinical to make sure this is accurate.

## 2017-01-05 NOTE — Telephone Encounter (Signed)
BID dosage was discontinued in January by Liborio NixonAmanda Grissom.  Please advise of correct frequency of administration.  Tiajuana Amass. Nelson, CMA

## 2017-01-05 NOTE — Telephone Encounter (Signed)
Pt states that he has an appointment on 01/11/17 with Dr. Sharee Holsterpalski.  Advised pt to discuss amlodipine dosage with Dr. Sharee Holsterpalski at that time.  Tiajuana Amass. Nelson, CMA

## 2017-01-11 ENCOUNTER — Other Ambulatory Visit (INDEPENDENT_AMBULATORY_CARE_PROVIDER_SITE_OTHER): Payer: BLUE CROSS/BLUE SHIELD

## 2017-01-11 DIAGNOSIS — E559 Vitamin D deficiency, unspecified: Secondary | ICD-10-CM | POA: Diagnosis not present

## 2017-01-11 DIAGNOSIS — E781 Pure hyperglyceridemia: Secondary | ICD-10-CM

## 2017-01-11 DIAGNOSIS — I159 Secondary hypertension, unspecified: Secondary | ICD-10-CM | POA: Diagnosis not present

## 2017-01-11 DIAGNOSIS — E66813 Obesity, class 3: Secondary | ICD-10-CM

## 2017-01-12 LAB — CBC WITH DIFFERENTIAL/PLATELET
BASOS: 1 %
Basophils Absolute: 0 10*3/uL (ref 0.0–0.2)
EOS (ABSOLUTE): 0.2 10*3/uL (ref 0.0–0.4)
EOS: 5 %
Hematocrit: 39.6 % (ref 37.5–51.0)
Hemoglobin: 13.8 g/dL (ref 13.0–17.7)
Immature Grans (Abs): 0 10*3/uL (ref 0.0–0.1)
Immature Granulocytes: 0 %
LYMPHS: 41 %
Lymphocytes Absolute: 2 10*3/uL (ref 0.7–3.1)
MCH: 30.1 pg (ref 26.6–33.0)
MCHC: 34.8 g/dL (ref 31.5–35.7)
MCV: 87 fL (ref 79–97)
MONOS ABS: 0.5 10*3/uL (ref 0.1–0.9)
Monocytes: 11 %
NEUTROS PCT: 42 %
Neutrophils Absolute: 2.1 10*3/uL (ref 1.4–7.0)
Platelets: 318 10*3/uL (ref 150–379)
RBC: 4.58 x10E6/uL (ref 4.14–5.80)
RDW: 13.8 % (ref 12.3–15.4)
WBC: 4.8 10*3/uL (ref 3.4–10.8)

## 2017-01-12 LAB — COMPREHENSIVE METABOLIC PANEL
A/G RATIO: 2.3 — AB (ref 1.2–2.2)
ALBUMIN: 4.5 g/dL (ref 3.5–5.5)
ALK PHOS: 34 IU/L — AB (ref 39–117)
ALT: 26 IU/L (ref 0–44)
AST: 13 IU/L (ref 0–40)
BILIRUBIN TOTAL: 0.2 mg/dL (ref 0.0–1.2)
BUN / CREAT RATIO: 13 (ref 9–20)
BUN: 13 mg/dL (ref 6–20)
CHLORIDE: 104 mmol/L (ref 96–106)
CO2: 24 mmol/L (ref 20–29)
Calcium: 9.7 mg/dL (ref 8.7–10.2)
Creatinine, Ser: 0.98 mg/dL (ref 0.76–1.27)
GFR calc Af Amer: 113 mL/min/{1.73_m2} (ref >59)
GFR calc non Af Amer: 97 mL/min/{1.73_m2} (ref >59)
GLOBULIN, TOTAL: 2 g/dL (ref 1.5–4.5)
GLUCOSE: 90 mg/dL (ref 65–99)
POTASSIUM: 4.9 mmol/L (ref 3.5–5.2)
SODIUM: 145 mmol/L — AB (ref 134–144)
Total Protein: 6.5 g/dL (ref 6.0–8.5)

## 2017-01-12 LAB — VITAMIN D 25 HYDROXY (VIT D DEFICIENCY, FRACTURES): Vit D, 25-Hydroxy: 32.3 ng/mL (ref 30.0–100.0)

## 2017-01-12 LAB — LIPID PANEL
CHOL/HDL RATIO: 3.9 ratio (ref 0.0–5.0)
Cholesterol, Total: 186 mg/dL (ref 100–199)
HDL: 48 mg/dL (ref >39)
LDL Calculated: 117 mg/dL — ABNORMAL HIGH (ref 0–99)
TRIGLYCERIDES: 105 mg/dL (ref 0–149)
VLDL Cholesterol Cal: 21 mg/dL (ref 5–40)

## 2017-01-12 LAB — TSH: TSH: 3.23 u[IU]/mL (ref 0.450–4.500)

## 2017-01-13 ENCOUNTER — Ambulatory Visit (INDEPENDENT_AMBULATORY_CARE_PROVIDER_SITE_OTHER): Payer: BLUE CROSS/BLUE SHIELD | Admitting: Family Medicine

## 2017-01-13 ENCOUNTER — Encounter: Payer: Self-pay | Admitting: Family Medicine

## 2017-01-13 VITALS — BP 135/84 | HR 75 | Ht 70.25 in | Wt 312.5 lb

## 2017-01-13 DIAGNOSIS — E559 Vitamin D deficiency, unspecified: Secondary | ICD-10-CM

## 2017-01-13 DIAGNOSIS — F4323 Adjustment disorder with mixed anxiety and depressed mood: Secondary | ICD-10-CM

## 2017-01-13 DIAGNOSIS — I159 Secondary hypertension, unspecified: Secondary | ICD-10-CM

## 2017-01-13 DIAGNOSIS — E7800 Pure hypercholesterolemia, unspecified: Secondary | ICD-10-CM

## 2017-01-13 DIAGNOSIS — E66813 Obesity, class 3: Secondary | ICD-10-CM

## 2017-01-13 DIAGNOSIS — E781 Pure hyperglyceridemia: Secondary | ICD-10-CM

## 2017-01-13 DIAGNOSIS — F39 Unspecified mood [affective] disorder: Secondary | ICD-10-CM | POA: Insufficient documentation

## 2017-01-13 MED ORDER — LISINOPRIL-HYDROCHLOROTHIAZIDE 20-12.5 MG PO TABS: 1.0000 | ORAL_TABLET | Freq: Every day | ORAL | 1 refills | Status: DC

## 2017-01-13 MED ORDER — AMLODIPINE BESYLATE 5 MG PO TABS: 10.0000 mg | ORAL_TABLET | Freq: Every day | ORAL | 1 refills | Status: DC

## 2017-01-13 MED ORDER — VITAMIN D3 125 MCG (5000 UT) PO TABS: ORAL_TABLET | ORAL | 3 refills | Status: DC

## 2017-01-13 NOTE — Progress Notes (Signed)
Impression and Recommendations:    1. Hypertriglyceridemia   2. Obesity, Class III, BMI 40-49.9 (morbid obesity) (HCC)   3. Secondary hypertension   4. Vitamin D deficiency   5. Mood D/o    6. Elevated LDL cholesterol level     No problem-specific Assessment & Plan notes found for this encounter.    Education and routine counseling performed. Handouts provided.   New Prescriptions   CHOLECALCIFEROL (VITAMIN D3) 5000 UNITS TABS    5,000 IU OTC vitamin D3 daily.    Discontinued Medications   LISINOPRIL-HYDROCHLOROTHIAZIDE (PRINZIDE,ZESTORETIC) 20-12.5 MG TABLET    TAKE 1 TABLET BY MOUTH DAILY.     No orders of the defined types were placed in this encounter.    Return for Hypertension follow up every 4 mo.  The patient was counseled, risk factors were discussed, anticipatory guidance given.  Gross side effects, risk and benefits, and alternatives of medications discussed with patient.  Patient is aware that all medications have potential side effects and we are unable to predict every side effect or drug-drug interaction that may occur.  Expresses verbal understanding and consents to current therapy plan and treatment regimen.  Please see AVS handed out to patient at the end of our visit for further patient instructions/ counseling done pertaining to today's office visit.    Note: This document was prepared using Dragon voice recognition software and may include unintentional dictation errors.     Subjective:    Chief Complaint  Patient presents with  . Hypertension    HPI: Corey Martin is a 41 y.o. male who [facility] HTN.    --->  Mood:  Seeing Can Hines[facility].   More stress at work lately.    Pt being txed for bipolar through them with the mood stabilizer lamictal and wellbutrin as well.   Will get RF's from them   Wt:  Pt has not been doing well with eating  lately.  Eats out a lot and no exercise.   Knee   Problem  Mood D/o   Elevated Ldl Cholesterol Level     --->  HTN:  -  His blood pressure has not been controlled at home- unknwn.   - Patient reports good compliance with blood pressure medications  - Denies medication S-E   - Smoking Status noted   - He denies new onset of: chest pain, exercise intolerance, shortness of breath, dizziness, visual changes, headache, lower extremity swelling or claudication.   Today their BP is BP: 135/84   Last 3 blood pressure readings in our office are as follows: BP Readings from Last 3 Encounters:  01/13/17 135/84  09/03/16 127/80  07/29/16 131/82    Pulse Readings from Last 3 Encounters:  01/13/17 75  09/03/16 (!) 114  07/29/16 88    Filed Weights   01/13/17 1400  Weight: (!) 312 lb 8 oz (141.7 kg)      Patient Care Team    Relationship Specialty Notifications Start End  Thomasene Lotpalski, Oscar Forman, DO PCP - General Family Medicine  07/29/16   Cherly HensenAiken, Christopher B, MD  Psychiatry  01/13/17    Comment: seeing a doc there- not sure of name     Lab Results  Component Value Date   CREATININE 0.98 01/11/2017   BUN 13 01/11/2017   NA 145 (H) 01/11/2017   K 4.9 01/11/2017   CL 104 01/11/2017  CO2 24 01/11/2017    Lab Results  Component Value Date   CHOL 186 01/11/2017   CHOL 189 08/02/2016   CHOL 169 08/25/2015    Lab Results  Component Value Date   HDL 48 01/11/2017   HDL 48 08/02/2016   HDL 45 08/25/2015    Lab Results  Component Value Date   LDLCALC 117 (H) 01/11/2017   LDLCALC 118 (H) 08/02/2016   LDLCALC 93 08/25/2015    Lab Results  Component Value Date   TRIG 105 01/11/2017   TRIG 113 08/02/2016   TRIG 156 08/25/2015    Lab Results  Component Value Date   CHOLHDL 3.9 01/11/2017   CHOLHDL 3.9 08/02/2016    No results found for: LDLDIRECT ===================================================================   Patient Active Problem List   Diagnosis  Date Noted  . Mood D/o  01/13/2017    Priority: High  . HTN (hypertension) 07/29/2016    Priority: High  . Obesity, Class III, BMI 40-49.9 (morbid obesity) (HCC) 07/29/2016    Priority: High  . Hypertriglyceridemia 07/29/2016    Priority: High  . Generalized OA- knee 07/29/2016    Priority: Medium  . Elevated LDL cholesterol level 01/13/2017  . Vitamin D deficiency 01/04/2017  . Influenza A 09/03/2016  . History of meniscal tear- L knee 07/29/2016     Past Medical History:  Diagnosis Date  . Depression   . Hypercholesteremia   . Hypertension      Past Surgical History:  Procedure Laterality Date  . KNEE SURGERY       Family History  Problem Relation Age of Onset  . Hyperlipidemia Mother   . Hypertension Mother   . Heart attack Father   . Hyperlipidemia Father   . Hypertension Father   . Diabetes Maternal Grandmother      History  Drug Use No  ,  History  Alcohol Use  . 1.8 oz/week  . 1 Glasses of wine, 1 Cans of beer, 1 Shots of liquor per week  ,  History  Smoking Status  . Never Smoker  Smokeless Tobacco  . Never Used  ,    Current Outpatient Prescriptions on File Prior to Visit  Medication Sig Dispense Refill  . buPROPion (WELLBUTRIN XL) 300 MG 24 hr tablet TAKE 1 TABLET (300 MG TOTAL) BY MOUTH DAILY. 90 tablet 0  . Choline Fenofibrate (FENOFIBRIC ACID) 135 MG CPDR TAKE ONE CAPSULE EVERY DAY 30 capsule 4  . LamoTRIgine (LAMICTAL PO) Take 100 mg by mouth daily.     . Vitamin D, Ergocalciferol, (DRISDOL) 50000 units CAPS capsule TAKE 1 CAPSULE (50,000 UNITS TOTAL) BY MOUTH EVERY 7 (SEVEN) DAYS. 12 capsule 0   No current facility-administered medications on file prior to visit.      No Known Allergies   Review of Systems:   General:  Denies fever, chills Optho/Auditory:   Denies visual changes, blurred vision Respiratory:   Denies SOB, cough, wheeze, DIB  Cardiovascular:   Denies chest pain, palpitations, painful  respirations Gastrointestinal:   Denies nausea, vomiting, diarrhea.  Endocrine:     Denies new hot or cold intolerance Musculoskeletal:  Denies joint swelling, gait issues, or new unexplained myalgias/ arthralgias Skin:  Denies rash, suspicious lesions  Neurological:    Denies dizziness, unexplained weakness, numbness  Psychiatric/Behavioral:   Denies mood changes  Objective:    Blood pressure 135/84, pulse 75, height 5' 10.25" (1.784 m), weight (!) 312 lb 8 oz (141.7 kg).  Body mass index is 44.52 kg/m.  General: Well Developed, well nourished, and in no acute distress.  HEENT: Normocephalic, atraumatic, pupils equal round reactive to light, neck supple, No carotid bruits, no JVD Skin: Warm and dry, cap RF less 2 sec Cardiac: Regular rate and rhythm, S1, S2 WNL's, no murmurs rubs or gallops Respiratory: ECTA B/L, Not using accessory muscles, speaking in full sentences. NeuroM-Sk: Ambulates w/o assistance, moves ext * 4 w/o difficulty, sensation grossly intact.  Ext: scant edema b/l lower ext Psych: No HI/SI, judgement and insight good, Euthymic mood. Full Affect.

## 2017-01-13 NOTE — Patient Instructions (Addendum)
Goal is just 5% wt loss--> this can have a significant affect on overall health and wellbeing     Behavior Modification Ideas for Weight Management  Weight management involves adopting a healthy lifestyle that includes a knowledge of nutrition and exercise, a positive attitude and the right kind of motivation. Internal motives such as better health, increased energy, self-esteem and personal control increase your chances of lifelong weight management success.  Remember to have realistic goals and think long-term success. Believe in yourself and you can do it. The following information will give you ideas to help you meet your goals.  Control Your Home Environment  Eat only while sitting down at the kitchen or dining room table. Do not eat while watching television, reading, cooking, talking on the phone, standing at the refrigerator or working on the computer. Keep tempting foods out of the house - don't buy them. Keep tempting foods out of sight. Have low-calorie foods ready to eat. Unless you are preparing a meal, stay out of the kitchen. Have healthy snacks at your disposal, such as small pieces of fruit, vegetables, canned fruit, pretzels, low-fat string cheese and nonfat cottage cheese.  Control Your Work Environment  Do not eat at [employer] or keep tempting snacks at your desk. If you get hungry between meals, plan healthy snacks and bring them with you to work. During your breaks, go for a walk instead of eating. If you work around food, plan in advance the one item you will eat at mealtime. Make it inconvenient to nibble on food by chewing gum, sugarless candy or drinking water or another low-calorie beverage. Do not work through meals. Skipping meals slows down metabolism and may result in overeating at the next meal. If food is available for special occasions, either pick the healthiest item, nibble on low-fat snacks brought from home, don't have anything offered, choose one  option and have a small amount, or have only a beverage.  Control Your Mealtime Environment  Serve your plate of food at the stove or kitchen counter. Do not put the serving dishes on the table. If you do put dishes on the table, remove them immediately when finished eating. Fill half of your plate with vegetables, a quarter with lean protein and a quarter with starch. Use smaller plates, bowls and glasses. A smaller portion will look large when it is in a little dish. Politely refuse second helpings. When fixing your plate, limit portions of food to one scoop/serving or less.   Daily Food Management  Replace eating with another activity that you will not associate with food. Wait 20 minutes before eating something you are craving. Drink a large glass of water or diet soda before eating. Always have a big glass or bottle of water to drink throughout the day. Avoid high-calorie add-ons such as cream with your coffee, butter, mayonnaise and salad dressings.  Shopping: Do not shop when hungry or tired. Shop from a list and avoid buying anything that is not on your list. If you must have tempting foods, buy individual-sized packages and try to find a lower-calorie alternative. Don't taste test in the store. Read food labels. Compare products to help you make the healthiest choices.  Preparation: Chew a piece of gum while cooking meals. Use a quarter teaspoon if you taste test your food. Try to only fix what you are going to eat, leaving yourself no chance for seconds. If you have prepared more food than you need, portion it into individual  containers and freeze or refrigerate immediately. Don't snack while cooking meals.  Eating: Eat slowly. Remember it takes about 20 minutes for your stomach to send a message to your brain that it is full. Don't let fake hunger make you think you need more. The ideal way to eat is to take a bite, put your utensil down, take a sip of water, cut your  next bite, take a bit, put your utensil down and so on. Do not cut your food all at one time. Cut only as needed. Take small bites and chew your food well. Stop eating for a minute or two at least once during a meal or snack. Take breaks to reflect and have conversation.  Cleanup and Leftovers: Label leftovers for a specific meal or snack. Freeze or refrigerate individual portions of leftovers. Do not clean up if you are still hungry.  Eating Out and Social Eating  Do not arrive hungry. Eat something light before the meal. Try to fill up on low-calorie foods, such as vegetables and fruit, and eat smaller portions of the high-calorie foods. Eat foods that you like, but choose small portions. If you want seconds, wait at least 20 minutes after you have eaten to see if you are actually hungry or if your eyes are bigger than your stomach. Limit alcoholic beverages. Try a soda water with a twist of lime. Do not skip other meals in the day to save room for the special event.  At Restaurants: Order  la carte rather than buffet style. Order some vegetables or a salad for an appetizer instead of eating bread. If you order a high-calorie dish, share it with someone. Try an after-dinner mint with your coffee. If you do have dessert, share it with two or more people. Don't overeat because you do not want to waste food. Ask for a doggie bag to take extra food home. Tell the server to put half of your entree in a to go bag before the meal is served to you. Ask for salad dressing, gravy or high-fat sauces on the side. Dip the tip of your fork in the dressing before each bite. If bread is served, ask for only one piece. Try it plain without butter or oil. At [employer] where oil and vinegar is served with bread, use only a small amount of oil and a lot of vinegar for dipping.  At a Friend's House: Offer to bring a dish, appetizer or dessert that is low in calories. Serve yourself small  portions or tell the host that you only want a small amount. Stand or sit away from the snack table. Stay away from the kitchen or stay busy if you are near the food. Limit your alcohol intake.  At [employer] and Cafeterias: Cover most of your plate with lettuce and/or vegetables. Use a salad plate instead of a dinner plate. After eating, clear away your dishes before having coffee or tea.  Entertaining at Home: Explore low-fat, low-cholesterol cookbooks. Use single-serving foods like chicken breasts or hamburger patties. Prepare low-calorie appetizers and desserts.   Holidays: Keep tempting foods out of sight. Decorate the house without using food. Have low-calorie beverages and foods on hand for guests. Allow yourself one planned treat a day. Don't skip meals to save up for the holiday feast. Eat regular, planned meals.   Exercise Well  Make exercise a priority and a planned activity in the day. If possible, walk the entire or part of the distance to work. Get  an exercise buddy. Go for a walk with a colleague during one of your breaks, go to the gym, run or take a walk with a friend, walk in the mall with a shopping companion. Park at the end of the parking lot and walk to the store or office entrance. Always take the stairs all of the way or at least part of the way to your floor. If you have a desk job, walk around the office frequently. Do leg lifts while sitting at your desk. Do something outside on the weekends like going for a hike or a bike ride.   Have a Healthy Attitude  Make health your weight management priority. Be realistic. Have a goal to achieve a healthier you, not necessarily the lowest weight or ideal weight based on calculations or tables. Focus on a healthy eating style, not on dieting. Dieting usually lasts for a short amount of time and rarely produces long-term success. Think long term. You are developing new healthy behaviors to follow next month, in a  year and in a decade.    This information is for educational purposes only and is not intended to replace the advice of your doctor or health care provider. We encourage you to discuss with your doctor any questions or concerns you may have.        Guidelines for Losing Weight   We want weight loss that will last so you should lose 1-2 pounds a week.  THAT IS IT! Please pick THREE things a month to change. Once it is a habit check off the item. Then pick another three items off the list to become habits.  If you are already doing a habit on the list GREAT!  Cross that item off!  Don't drink your calories. Ie, alcohol, soda, fruit juice, and sweet tea.   Drink more water. Drink a glass when you feel hungry or before each meal.   Eat breakfast - Complex carb and protein (likeDannon light and fit yogurt, oatmeal, fruit, eggs, [place] bacon).  Measure your cereal.  Eat no more than one cup a day. (ie Kashi)  Eat an apple a day.  Add a vegetable a day.  Try a new vegetable a month.  Use Pam! Stop using oil or butter to cook.  Don't finish your plate or use smaller plates.  Share your dessert.  Eat sugar free Jello for dessert or frozen grapes.  Don't eat 2-3 hours before bed.  Switch to whole wheat bread, pasta, and brown rice.  Make healthier choices when you eat out. No fries!  Pick baked chicken, NOT fried.  Don't forget to SLOW DOWN when you eat. It is not going anywhere.   Take the stairs.  Park far away in the parking lot  Lift soup cans (or weights) for 10 minutes while watching TV.  Walk at work for 10 minutes during break.  Walk outside 1 time a week with your friend, kids, dog, or significant other.  Start a walking group at church.  Walk the mall as much as you can tolerate.   Keep a food diary.  Weigh yourself daily.  Walk for 15 minutes 3 days per week.  Cook at home more often and eat out less. If life happens and you go back to old  habits, it is okay.  Just start over. You can do it!  If you experience chest pain, get short of breath, or tired during the exercise, please stop immediately and inform your  doctor.    Before you even begin to attack a weight-loss plan, it pays to remember this: You are not fat. You have fat. Losing weight isn't about blame or shame; it's simply another achievement to accomplish. Dieting is like any other skill-you have to buckle down and work at it. As long as you act in a smart, reasonable way, you'll ultimately get where you want to be. Here are some weight loss pearls for you.   1. It's Not a Diet. It's a Lifestyle Thinking of a diet as something you're on and suffering through only for the short term doesn't work. To shed weight and keep it off, you need to make permanent changes to the way you eat. It's OK to indulge occasionally, of course, but if you cut calories temporarily and then revert to your old way of eating, you'll gain back the weight quicker than you can say yo-yo. Use it to lose it. Research shows that one of the best predictors of long-term weight loss is how many pounds you drop in the first month. For that reason, nutritionists often suggest being stricter for the first two weeks of your new eating strategy to build momentum. Cut out added sugar and alcohol and avoid unrefined carbs. After that, figure out how you can reincorporate them in a way that's healthy and maintainable.  2. There's a Right Way to Exercise Working out burns calories and fat and boosts your metabolism by building muscle. But those trying to lose weight are notorious for overestimating the number of calories they burn and underestimating the amount they take in. Unfortunately, your system is biologically programmed to hold on to extra pounds and that means when you start exercising, your body senses the deficit and ramps up its hunger signals. If you're not diligent, you'll eat everything you burn and then  some. Use it, to lose it. Cardio gets all the exercise glory, but strength and interval training are the real heroes. They help you build lean muscle, which in turn increases your metabolism and calorie-burning ability 3. Don't Overreact to Mild Hunger Some people have a hard time losing weight because of hunger anxiety. To them, being hungry is bad-something to be avoided at all costs-so they carry snacks with them and eat when they don't need to. Others eat because they're stressed out or bored. While you never want to get to the point of being ravenous (that's when bingeing is likely to happen), a hunger pang, a craving, or the fact that it's 3:00 p.m. should not send you racing for the vending machine or obsessing about the energy bar in your purse. Ideally, you should put off eating until your stomach is growling and it's difficult to concentrate.  Use it to lose it. When you feel the urge to eat, use the HALT method. Ask yourself, Am I really hungry? Or am I angry or anxious, lonely or bored, or tired? If you're still not certain, try the apple test. If you're truly hungry, an apple should seem delicious; if it doesn't, something else is going on. Or you can try drinking water and making yourself busy, if you are still hungry try a healthy snack.  4. Not All Calories Are Created Equal The mechanics of weight loss are pretty simple: Take in fewer calories than you use for energy. But the kind of food you eat makes all the difference. Processed food that's high in saturated fat and refined starch or sugar can cause inflammation that disrupts  the hormone signals that tell your brain you're full. The result: You eat a lot more.  Use it to lose it. Clean up your diet. Swap in whole, unprocessed foods, including vegetables, lean protein, and healthy fats that will fill you up and give you the biggest nutritional bang for your calorie buck. In a few weeks, as your brain starts receiving regular hunger and  fullness signals once again, you'll notice that you feel less hungry overall and naturally start cutting back on the amount you eat.  5. Protein, Produce, and Plant-Based Fats Are Your Weight-Loss Trinity Here's why eating the three Ps regularly will help you drop pounds. Protein fills you up. You need it to build lean muscle, which keeps your metabolism humming so that you can torch more fat. People in a weight-loss program who ate double the recommended daily allowance for protein (about 110 grams for a 150-pound woman) lost 70 percent of their weight from fat, while people who ate the RDA lost only about 40 percent, one study found. Produce is packed with filling fiber. "It's very difficult to consume too many calories if you're eating a lot of vegetables. Example: Three cups of broccoli is a lot of food, yet only 93 calories. (Fruit is another story. It can be easy to overeat and can contain a lot of calories from sugar, so be sure to monitor your intake.) Plant-based fats like olive oil and those in avocados and nuts are healthy and extra satiating.  Use it to lose it. Aim to incorporate each of the three Ps into every meal and snack. People who eat protein throughout the day are able to keep weight off, according to a study in the American Journal of Clinical Nutrition. In addition to meat, poultry and seafood, good sources are beans, lentils, eggs, tofu, and yogurt. As for fat, keep portion sizes in check by measuring out salad dressing, oil, and nut butters (shoot for one to two tablespoons). Finally, eat veggies or a little fruit at every meal. People who did that consumed 308 fewer calories but didn't feel any hungrier than when they didn't eat more produce.  7. How You Eat Is As Important As What You Eat In order for your brain to register that you're full, you need to focus on what you're eating. Sit down whenever you eat, preferably at a table. Turn off the TV or computer, put down your phone,  and look at your food. Smell it. Chew slowly, and don't put another bite on your fork until you swallow. When women ate lunch this attentively, they consumed 30 percent less when snacking later than those who listened to an audiobook at lunchtime, according to a study in the [place] Journal of Nutrition. 8. Weighing Yourself Really Works The scale provides the best evidence about whether your efforts are paying off. Seeing the numbers tick up or down or stagnate is motivation to keep going-or to rethink your approach. A 2015 study at [facility] found that daily weigh-ins helped people lose more weight, keep it off, and maintain that loss, even after two years. Use it to lose it. Step on the scale at the same time every day for the best results. If your weight shoots up several pounds from one weigh-in to the next, don't freak out. Eating a lot of salt the night before or having your period is the likely culprit. The number should return to normal in a day or two. It's a steady climb that you  need to do something about. 9. Too Much Stress and Too Little Sleep Are Your Enemies When you're tired and frazzled, your body cranks up the production of cortisol, the stress hormone that can cause carb cravings. Not getting enough sleep also boosts your levels of ghrelin, a hormone associated with hunger, while suppressing leptin, a hormone that signals fullness and satiety. People on a diet who slept only five and a half hours a night for two weeks lost 55 percent less fat and were hungrier than those who slept eight and a half hours, according to a study in the [place] [employer] Journal. Use it to lose it. Prioritize sleep, aiming for seven hours or more a night, which research shows helps lower stress. And make sure you're getting quality zzz's. If a snoring spouse or a fidgety cat wakes you up frequently throughout the night, you may end up getting the equivalent of just four hours of sleep,  according to [facility] [employer]. Keep pets out of the bedroom, and use a white-noise app to drown out snoring. 10. You Will Hit a plateau-And You Can Bust Through It As you slim down, your body releases much less leptin, the fullness hormone.  If you're not strength training, start right now. Building muscle can raise your metabolism to help you overcome a plateau. To keep your body challenged and burning calories, incorporate new moves and more intense intervals into your workouts or add another sweat session to your weekly routine. Alternatively, cut an extra 100 calories or so a day from your diet. Now that you've lost weight, your body simply doesn't need as much fuel.    Since food equals calories, in order to lose weight you must either eat fewer calories, exercise more to burn off calories with activity, or both. Food that is not used to fuel the body is stored as fat. A major component of losing weight is to make smarter food choices. Here's how:  1)   Limit non-nutritious foods, such as: Sugar, honey, syrups and candy Pastries, donuts, pies, cakes and cookies Soft drinks, sweetened juices and alcoholic beverages  2)  Cut down on high-fat foods by: - Choosing poultry, fish or lean red meat - Choosing low-fat cooking methods, such as baking, broiling, steaming, grilling and boiling - Using low-fat or non-fat dairy products - Using vinaigrette, herbs, lemon or fat-free salad dressings - Avoiding fatty meats, such as bacon, sausage, franks, ribs and luncheon meats - Avoiding high-fat snacks like nuts, chips and chocolate - Avoiding fried foods - Using less butter, margarine, oil and mayonnaise - Avoiding high-fat gravies, cream sauces and cream-based soups  3) Eat a variety of foods, including: - Fruit and vegetables that are raw, steamed or baked - Whole grains, breads, cereal, rice and pasta - Dairy products, such as low-fat or non-fat milk or yogurt, low-fat cottage  cheese and low-fat cheese - Protein-rich foods like chicken, [place], fish, lean meat and legumes, or beans  4) Change your eating habits by: - Eat three balanced meals a day to help control your hunger - Watch portion sizes and eat small servings of a variety of foods - Choose low-calorie snacks - Eat only when you are hungry and stop when you are satisfied - Eat slowly and try not to perform other tasks while eating - Find other activities to distract you from food, such as walking, taking up a hobby or being involved in the community - Include regular exercise in  your daily routine ( minimum of 20 min of moderate-intensity exercise at least 5 days/week)  - Find a support group, if necessary, for emotional support in your weight loss journey           Easy ways to cut 100 calories   1. Eat your eggs with hot sauce OR salsa instead of cheese.  Eggs are great for breakfast, but many people consider eggs and cheese to be BFFs. Instead of cheese-1 oz. of cheddar has 114 calories-top your eggs with hot sauce, which contains no calories and helps with satiety and metabolism. Salsa is also a great option!!  2. Top your toast, waffles or pancakes with fresh berries instead of jelly or syrup. Half a cup of berries-fresh, frozen or thawed-has about 40 calories, compared with 2 tbsp. of maple syrup or jelly, which both have about 100 calories. The berries will also give you a good punch of fiber, which helps keep you full and satisfied and won't spike blood sugar quickly like the jelly or syrup. 3. Swap the non-fat latte for black coffee with a splash of half-and-half. Contrary to its name, that non-fat latte has 130 calories and a startling 19g of carbohydrates per 16 oz. serving. Replacing that 'light' drinkable dessert with a black coffee with a splash of half-and-half saves you more than 100 calories per 16 oz. serving. 4. Sprinkle salads with freeze-dried raspberries instead of dried  cranberries. If you want a sweet addition to your nutritious salad, stay away from dried cranberries. They have a whopping 130 calories per  cup and 30g carbohydrates. Instead, sprinkle freeze-dried raspberries guilt-free and save more than 100 calories per  cup serving, adding 3g of belly-filling fiber. 5. Go for mustard in place of mayo on your sandwich. Mustard can add really nice flavor to any sandwich, and there are tons of varieties, from spicy to honey. A serving of mayo is 95 calories, versus 10 calories in a serving of mustard.  Or try an avocado mayo spread: You can find the recipe few click this link: https://www.californiaavocado.com/recipes/recipe-container/[place]ifornia-avocado-mayo 6. Choose a DIY salad dressing instead of the store-bought kind. Mix Dijon or whole grain mustard with low-fat Kefir or red wine vinegar and garlic. 7. Use hummus as a spread instead of a dip. Use hummus as a spread on a high-fiber cracker or tortilla with a sandwich and save on calories without sacrificing taste. 8. Pick just one salad "accessory." Salad isn't automatically a calorie winner. It's easy to over-accessorize with toppings. Instead of topping your salad with nuts, avocado and cranberries (all three will clock in at 313 calories), just pick one. The next day, choose a different accessory, which will also keep your salad interesting. You don't wear all your jewelry every day, right? 9. Ditch the white pasta in favor of spaghetti squash. One cup of cooked spaghetti squash has about 40 calories, compared with traditional spaghetti, which comes with more than 200. Spaghetti squash is also nutrient-dense. It's a good source of fiber and Vitamins A and C, and it can be eaten just like you would eat pasta-with a great tomato sauce and [place] meatballs or with pesto, tofu and spinach, for example. 10. Dress up your chili, soups and stews with non-fat [place] yogurt instead of sour cream. Just a 'dollop' of  sour cream can set you back 115 calories and a whopping 12g of fat-seven of which are of the artery-clogging variety. Added bonus: [place] yogurt is packed with muscle-building protein, calcium and B  Vitamins. 11. Mash cauliflower instead of mashed potatoes. One cup of traditional mashed potatoes-in all their creamy goodness-has more than 200 calories, compared to mashed cauliflower, which you can typically eat for less than 100 calories per 1 cup serving. Cauliflower is a great source of the antioxidant indole-3-carbinol (I3C), which may help reduce the risk of some cancers, like breast cancer. 12. Ditch the ice cream sundae in favor of a [place] yogurt parfait. Instead of a cup of ice cream or fro-yo for dessert, try 1 cup of nonfat Greek yogurt topped with fresh berries and a sprinkle of cacao nibs. Both toppings are packed with antioxidants, which can help reduce cellular inflammation and oxidative damage. And the comparison is a no-brainer: One cup of ice cream has about 275 calories; one cup of frozen yogurt has about 230; and a cup of Greek yogurt has just 130, plus twice the protein, so you're less likely to return to the freezer for a second helping. 13. Put olive oil in a spray container instead of using it directly from the bottle. Each tablespoon of olive oil is 120 calories and 15g of fat. Use a mister instead of pouring it straight into the pan or onto a salad. This allows for portion control and will save you more than 100 calories. 14. When baking, substitute canned pumpkin for butter or oil. Canned pumpkin-not pumpkin pie mix-is loaded with Vitamin A, which is important for skin and eye health, as well as immunity. And the comparisons are pretty crazy:  cup of canned pumpkin has about 40 calories, compared to butter or oil, which has more than 800 calories. Yes, 800 calories. Applesauce and mashed banana can also serve as good substitutions for butter or oil, usually in a 1:1 ratio. 15. Top  casseroles with high-fiber cereal instead of breadcrumbs. Breadcrumbs are typically made with white bread, while breakfast cereals contain 5-9g of fiber per serving. Not only will you save more than 150 calories per  cup serving, the swap will also keep you more full and you'll get a metabolism boost from the added fiber. 16. Snack on pistachios instead of macadamia nuts. Believe it or not, you get the same amount of calories from 35 pistachios (100 calories) as you would from only five macadamia nuts. 17. Chow down on kale chips rather than potato chips. This is my favorite 'don't knock it 'till you try it' swap. Kale chips are so easy to make at home, and you can spice them up with a little grated parmesan or chili powder. Plus, they're a mere fraction of the calories of potato chips, but with the same crunch factor we crave so often. 18. Add seltzer and some fruit slices to your cocktail instead of soda or fruit juice. One cup of soda or fruit juice can pack on as much as 140 calories. Instead, use seltzer and fruit slices. The fruit provides valuable phytochemicals, such as flavonoids and anthocyanins, which help to combat cancer and stave off the aging process.

## 2017-01-31 ENCOUNTER — Other Ambulatory Visit: Payer: Self-pay | Admitting: Family Medicine

## 2017-02-03 ENCOUNTER — Other Ambulatory Visit: Payer: Self-pay | Admitting: Family Medicine

## 2017-02-03 NOTE — Telephone Encounter (Signed)
Patient is requesting a refill of his amlodipine, they are going out of town on Monday and would like to get it refilled before then. Sent to [facility] [address].

## 2017-02-03 NOTE — Telephone Encounter (Signed)
Spoke with pharmacy and patient, amlodipine will be filled.

## 2017-03-30 ENCOUNTER — Other Ambulatory Visit: Payer: Self-pay

## 2017-03-30 DIAGNOSIS — F4323 Adjustment disorder with mixed anxiety and depressed mood: Secondary | ICD-10-CM

## 2017-03-30 MED ORDER — BUPROPION HCL ER (XL) 300 MG PO TB24: 300.0000 mg | ORAL_TABLET | Freq: Every day | ORAL | 0 refills | Status: DC

## 2017-03-30 NOTE — Telephone Encounter (Signed)
Pharmacy sent refill request for Bupropion, patient has appointment in October. MPulliam, CMA/RT(R)

## 2017-04-09 ENCOUNTER — Other Ambulatory Visit: Payer: Self-pay | Admitting: Family Medicine

## 2017-04-22 ENCOUNTER — Other Ambulatory Visit: Payer: Self-pay | Admitting: Adult Health

## 2017-05-06 ENCOUNTER — Other Ambulatory Visit: Payer: Self-pay | Admitting: Family Medicine

## 2017-05-06 DIAGNOSIS — F4323 Adjustment disorder with mixed anxiety and depressed mood: Secondary | ICD-10-CM

## 2017-05-09 NOTE — Telephone Encounter (Signed)
Pt needs f/up appt for further RF- suppose to f/up early OCt for OV with me for his BP

## 2017-05-17 ENCOUNTER — Ambulatory Visit (INDEPENDENT_AMBULATORY_CARE_PROVIDER_SITE_OTHER): Payer: BLUE CROSS/BLUE SHIELD | Admitting: Family Medicine

## 2017-05-17 ENCOUNTER — Encounter: Payer: Self-pay | Admitting: Family Medicine

## 2017-05-17 VITALS — BP 122/84 | HR 94 | Ht 70.25 in | Wt 318.5 lb

## 2017-05-17 DIAGNOSIS — I159 Secondary hypertension, unspecified: Secondary | ICD-10-CM | POA: Diagnosis not present

## 2017-05-17 DIAGNOSIS — E781 Pure hyperglyceridemia: Secondary | ICD-10-CM | POA: Diagnosis not present

## 2017-05-17 DIAGNOSIS — F39 Unspecified mood [affective] disorder: Secondary | ICD-10-CM

## 2017-05-17 DIAGNOSIS — E559 Vitamin D deficiency, unspecified: Secondary | ICD-10-CM

## 2017-05-17 DIAGNOSIS — Z23 Encounter for immunization: Secondary | ICD-10-CM

## 2017-05-17 MED ORDER — VITAMIN D (ERGOCALCIFEROL) 1.25 MG (50000 UNIT) PO CAPS: 50000.0000 [IU] | ORAL_CAPSULE | ORAL | 3 refills | Status: DC

## 2017-05-17 NOTE — Progress Notes (Signed)
Impression and Recommendations:    1. Obesity, Class III, BMI 40-49.9 (morbid obesity) (HCC)   2. Secondary hypertension   3. Hypertriglyceridemia   4. Mood disorder (HCC)   5. Vitamin D deficiency   6. Flu vaccine need     - Vitamin D deficiency - Vitamin D, Ergocalciferol, (DRISDOL) 50000 units CAPS capsule; Take 1 capsule (50,000 Units total) by mouth every 7 (seven) days.  Dispense: 4 capsule; Refill: 0  - BP controlled cont meds    Education and routine counseling performed. Handouts provided.   New Prescriptions   No medications on file    Discontinued Medications   LISINOPRIL-HYDROCHLOROTHIAZIDE (PRINZIDE,ZESTORETIC) 20-12.5 MG TABLET    TAKE 1 TABLET BY MOUTH EVERY DAY    Modified Medications   Modified Medication Previous Medication   AMLODIPINE (NORVASC) 5 MG TABLET amLODipine (NORVASC) 5 MG tablet      TAKE 2 TABLETS BY MOUTH EVERY DAY    Take 2 tablets (10 mg total) by mouth daily.   CHOLINE FENOFIBRATE (FENOFIBRIC ACID) 135 MG CPDR Choline Fenofibrate (FENOFIBRIC ACID) 135 MG CPDR      TAKE 1 CAPSULE BY MOUTH EVERY DAY    TAKE ONE CAPSULE EVERY DAY   VITAMIN D, ERGOCALCIFEROL, (DRISDOL) 50000 UNITS CAPS CAPSULE Vitamin D, Ergocalciferol, (DRISDOL) 50000 units CAPS capsule      Take 1 capsule (50,000 Units total) by mouth every 7 (seven) days.    TAKE 1 CAPSULE (50,000 UNITS TOTAL) BY MOUTH EVERY 7 (SEVEN) DAYS.    Orders Placed This Encounter  Procedures  . Flu Vaccine QUAD 6+ mos PF IM (Fluarix Quad PF)     Return in about 8 weeks (around 07/12/2017).  The patient was counseled, risk factors were discussed, anticipatory guidance given.  Gross side effects, risk and benefits, and alternatives of medications discussed with patient.  Patient is aware that all medications have potential side effects and we are unable to predict every side effect or drug-drug interaction that may occur.  Expresses verbal understanding and consents to current therapy  plan and treatment regimen.  Please see AVS handed out to patient at the end of our visit for further patient instructions/ counseling done pertaining to today's office visit.    Note: This document was prepared using Dragon voice recognition software and may include unintentional dictation errors.     Subjective:    Chief Complaint  Patient presents with  . Follow-up    HPI: Corey Martin is a 41 y.o. male who presents to [facility] today for follow up for HTN.    Obesity:  Pt states he is "really unhappy with his wt."   Using lose it app.   Nutrition consult was put in for him last office visit was not financially viable option for him.  Lost 80lbs in past- cut out all carbs and sweets and nothing fried.     HTN:  -  His blood pressure has been controlled at home.   - Patient reports good compliance with blood pressure medications  - Denies medication S-E   - Smoking Status noted   - He denies new onset of: chest pain, exercise intolerance, shortness of breath, dizziness, visual changes, headache, lower extremity swelling or claudication.   Today their BP is BP: 122/84   Last 3 blood pressure readings in our office are as follows: BP Readings from Last 3 Encounters:  08/30/17 120/83  05/17/17 122/84  01/13/17 135/84  Pulse Readings from Last 3 Encounters:  08/30/17 89  05/17/17 94  01/13/17 75    Filed Weights   05/17/17 1529  Weight: (!) 318 lb 8 oz (144.5 kg)      Patient Care Team    Relationship Specialty Notifications Start End  [name], DO PCP - General Family Medicine  07/29/16   Cherly Hensen, MD  Psychiatry  01/13/17    Comment: seeing a doc there- not sure of name     Lab Results  Component Value Date   CREATININE 0.98 01/11/2017   BUN 13 01/11/2017   NA 145 (H) 01/11/2017   K 4.9 01/11/2017   CL 104 01/11/2017   CO2 24 01/11/2017    Lab Results  Component Value Date   CHOL 186  01/11/2017   CHOL 189 08/02/2016   CHOL 169 08/25/2015    Lab Results  Component Value Date   HDL 48 01/11/2017   HDL 48 08/02/2016   HDL 45 08/25/2015    Lab Results  Component Value Date   LDLCALC 117 (H) 01/11/2017   LDLCALC 118 (H) 08/02/2016   LDLCALC 93 08/25/2015    Lab Results  Component Value Date   TRIG 105 01/11/2017   TRIG 113 08/02/2016   TRIG 156 08/25/2015    Lab Results  Component Value Date   CHOLHDL 3.9 01/11/2017   CHOLHDL 3.9 08/02/2016    No results found for: LDLDIRECT ===================================================================   Patient Active Problem List   Diagnosis Date Noted  . Mood disorder (HCC) 01/13/2017    Priority: High  . HTN (hypertension) 07/29/2016    Priority: High  . Obesity, Class III, BMI 40-49.9 (morbid obesity) (HCC) 07/29/2016    Priority: High  . Hypertriglyceridemia 07/29/2016    Priority: High  . Generalized OA- knee 07/29/2016    Priority: Medium  . Elevated LDL cholesterol level 01/13/2017  . Vitamin D deficiency 01/04/2017  . Influenza A 09/03/2016  . History of meniscal tear- L knee 07/29/2016     Past Medical History:  Diagnosis Date  . Depression   . Hypercholesteremia   . Hypertension      Past Surgical History:  Procedure Laterality Date  . KNEE SURGERY       Family History  Problem Relation Age of Onset  . Hyperlipidemia Mother   . Hypertension Mother   . Heart attack Father   . Hyperlipidemia Father   . Hypertension Father   . Diabetes Maternal Grandmother      Social History   Substance and Sexual Activity  Drug Use No  ,  Social History   Substance and Sexual Activity  Alcohol Use Yes  . Alcohol/week: 1.8 oz  . Types: 1 Glasses of wine, 1 Cans of beer, 1 Shots of liquor per week  ,  Social History   Tobacco Use  Smoking Status Never Smoker  Smokeless Tobacco Never Used  ,    Current Outpatient Medications on File Prior to Visit  Medication Sig  Dispense Refill  . buPROPion (WELLBUTRIN XL) 300 MG 24 hr tablet TAKE 1 TABLET BY MOUTH EVERY DAY 30 tablet 0  . Cholecalciferol (VITAMIN D3) 5000 units TABS 5,000 IU OTC vitamin D3 daily. 90 tablet 3  . LamoTRIgine (LAMICTAL PO) Take 100 mg by mouth daily.     Marland Kitchen lisinopril-hydrochlorothiazide (PRINZIDE,ZESTORETIC) 20-12.5 MG tablet Take 1 tablet by mouth daily. 90 tablet 1   No current facility-administered medications on file prior to visit.  No Known Allergies   Review of Systems:   General:  Denies fever, chills Optho/Auditory:   Denies visual changes, blurred vision Respiratory:   Denies SOB, cough, wheeze, DIB  Cardiovascular:   Denies chest pain, palpitations, painful respirations Gastrointestinal:   Denies nausea, vomiting, diarrhea.  Endocrine:     Denies new hot or cold intolerance Musculoskeletal:  Denies joint swelling, gait issues, or new unexplained myalgias/ arthralgias Skin:  Denies rash, suspicious lesions  Neurological:    Denies dizziness, unexplained weakness, numbness  Psychiatric/Behavioral:   Denies mood changes  Objective:    Blood pressure 122/84, pulse 94, height 5' 10.25" (1.784 m), weight (!) 318 lb 8 oz (144.5 kg).  Body mass index is 45.38 kg/m.  General: Well Developed, well nourished, and in no acute distress.  HEENT: Normocephalic, atraumatic, pupils equal round reactive to light, neck supple, No carotid bruits, no JVD Skin: Warm and dry, cap RF less 2 sec Cardiac: Regular rate and rhythm, S1, S2 WNL's, no murmurs rubs or gallops Respiratory: ECTA B/L, Not using accessory muscles, speaking in full sentences. NeuroM-Sk: Ambulates w/o assistance, moves ext * 4 w/o difficulty, sensation grossly intact.  Ext: scant edema b/l lower ext Psych: No HI/SI, judgement and insight good, Euthymic mood. Full Affect.

## 2017-05-17 NOTE — Patient Instructions (Addendum)
2 lbs per week- 20 lbs in 2 months is goal for wt loss.   - They're try to eat mostly protein then the second largest would be carbs then the least amount of fats when you look at your polygraph per day.  This should be similar to how you 10 when you lost her 80 pounds in the past.  Goal to walk 5 minutes twice daily  - Goal to continue to track everything you put in your body.

## 2017-06-12 ENCOUNTER — Other Ambulatory Visit: Payer: Self-pay | Admitting: Family Medicine

## 2017-07-04 ENCOUNTER — Other Ambulatory Visit: Payer: Self-pay | Admitting: Family Medicine

## 2017-07-13 ENCOUNTER — Ambulatory Visit: Payer: BLUE CROSS/BLUE SHIELD | Admitting: Family Medicine

## 2017-07-14 ENCOUNTER — Ambulatory Visit: Payer: BLUE CROSS/BLUE SHIELD | Admitting: Family Medicine

## 2017-07-31 ENCOUNTER — Other Ambulatory Visit: Payer: Self-pay | Admitting: Family Medicine

## 2017-07-31 DIAGNOSIS — I159 Secondary hypertension, unspecified: Secondary | ICD-10-CM

## 2017-08-11 ENCOUNTER — Telehealth: Payer: Self-pay | Admitting: Family Medicine

## 2017-08-11 NOTE — Telephone Encounter (Signed)
Please advise, thanks. MPulliam, CMA/RT(R)  

## 2017-08-11 NOTE — Telephone Encounter (Signed)
Rcvd call from patient states he has contacted Other provider multiple times over last 2 days left message but not response to ask for Rx refill --Patient wanted to see if PCP could either advise  On or call in Rx for him for at least 30dys supply although she is not original prescriber.  LamoTRIgine (LAMICTAL PO) [[id number]]  Order Details  Dose: 100 mg Route: Oral Frequency: Daily  Dispense Quantity: -- Refills: -- Fills remaining: --        Sig: Take 100 mg by mouth daily     Pt uses: Pharmacy:  [id number] [place], [place] - [address]  --Please call patient if there are any questions-- also pt ask to be contacted.   ---glh

## 2017-08-16 NOTE — Telephone Encounter (Signed)
I am unable to refill that medicine for patient -he will need to get that from the specialist

## 2017-08-18 NOTE — Telephone Encounter (Signed)
Called patient left message to call back MPulliam, CMA/RT(R)  

## 2017-08-19 NOTE — Telephone Encounter (Signed)
Spoke to patient and he was able to get in touch with the other office. MPulliam, CMA/RT(R)

## 2017-08-30 ENCOUNTER — Ambulatory Visit: Payer: BLUE CROSS/BLUE SHIELD | Admitting: Family Medicine

## 2017-08-30 ENCOUNTER — Encounter: Payer: Self-pay | Admitting: Family Medicine

## 2017-08-30 VITALS — BP 120/83 | HR 89 | Ht 70.25 in | Wt 315.2 lb

## 2017-08-30 DIAGNOSIS — J0140 Acute pansinusitis, unspecified: Secondary | ICD-10-CM

## 2017-08-30 DIAGNOSIS — I159 Secondary hypertension, unspecified: Secondary | ICD-10-CM

## 2017-08-30 DIAGNOSIS — H6983 Other specified disorders of Eustachian tube, bilateral: Secondary | ICD-10-CM

## 2017-08-30 DIAGNOSIS — E781 Pure hyperglyceridemia: Secondary | ICD-10-CM | POA: Diagnosis not present

## 2017-08-30 MED ORDER — FENOFIBRIC ACID 135 MG PO CPDR: 1.0000 | DELAYED_RELEASE_CAPSULE | Freq: Every day | ORAL | 1 refills | Status: DC

## 2017-08-30 NOTE — Patient Instructions (Addendum)
You most likely have a viral infection that should resolve on its own over time (or this could be a flare of seasonal allergies as well).   Symptoms for a viral upper respiratory tract infection usually last 3-7 days but can stretch out to 2-3 weeks before you're feeling back to normal.  Your symptoms should not worsen after 7-10 days and if they truely do, please notify our office, as you may need antibiotics.  Since amoxicillin or Augmentin has worked well in you in the past, if you are not having cough I would go with the higher dose of amoxicillin but if you do develop a cough and it feels like is going anterior chest and I recommend Augmentin  You can use over-the-counter afrin nasal spray for up to 3 days (NO longer than that) which will help acutely with nasal drainage/ congestion short term.   Also, sterile saline nasal rinses, such as Lloyd HugerNeil med or AYR sinus rinses, can be very helpful and should be done twice daily- especially throughout the allergy season.   Remember you should use distilled water or previously boiled water to do this.  After the sinus rinses do 1 spray of Flonase each nostril twice daily.  You can also use an over the counter cold and flu medication such as Tylenol Severe Cold and Sinus/Flu or Dayquil, Nyquil and the like, which will help with cough, congestion, headache/ pain, fevers/chills etc.  Please note, if you being treated for hypertension or have high blood pressure, you should be using the cold meds designated "HBP".   Coricidin HPV for sinusitis   Unfortunately, antibiotics are not helpful for viral infections.  Wash your hands frequently, as you did not want to get those around you sick as well. Never sneeze or cough on others.  And you should not be going to school or work if you are running a temperature of 100.5 or more on two separate occasions.   Drink plenty of fluids and stay hydrated, especially if you are running fevers.  We don't know why, but chicken  soup also helps, try it! :)       How to Treat Vertigo at Home with Exercises  What is Vertigo?  Vertigo is a relatively common symptom most often associated with conditions such as sinusitis (inflammation of your sinuses due to viruses, allergies, or bacterial infections), or an inner ear infection or ear trauma.   It can be brought on by trauma (e.g. a blow to the head or whiplash) or more serious things like minor strokes.   Symptoms can also be brought on by normal degenerative changes to your inner ear that occur with aging.  The condition tends to be more commonly seen in the elderly but it can occur in all ages.    Patients most often complain of dizziness, as if the room is spinning around them.   Symptoms are provoked by quick head movements or changes in position like going from standing to lying in bed, or even turning over in bed.   It may present with nausea and/or vomiting, and can be very debilitating to some folks.    By far the most common cause, known as Benign Paroxysmal Positional Vertigo (BPPV), is categorized by a sudden onset of symptoms, that are intense but short-lived (60 seconds or less), which is triggered by a change in head position.   Symptoms usually dissipate if you stay in one position and do not move your head.   Within  the inner ear are collections of calcium carbonate crystals referred to as "otoliths" which may become dislodged from their normal position and migrate into the semicircular canals of the inner ear, throwing off your body's ability to sense where you are in space.     Fig. 921 Anatomy of the Right Osseous Labyrinth. Shea Stakes. Anatomy of the Human Body. 1918.            What Else Could Be Behind My Vertigo?  Some other causes of vertigo include:  Meniere's disease (disorder of inner ear with ringing in ears, feeling of fullness/pressure within ear, and fluctuating hearing loss) Tumours Neurological disorders e.g. Multiple  Sclerosis Motion Sickness (lack of coordination between visual stimuli, inner ear balance and positional sense) Migraine Labyrinthitis (inflammation of the fluid-filled tubes and sacs within the inner ear; may also be associated with changes in hearing) Vestibular neuritis (inflammation of the nerves associated with transmission of sensory info from the inner ear; usually of viral origins)  How it can be treated/cured? While certain medications have been prescribed for vertigo including Lorazepam your doing well 7 house the house going organizing and getting things ready for sale with the and Meclizine (for motion sickness), there exists no evidence to support a recommendation of any medication in the routine treatment of BPPV.  Clinical trials have demonstrated that repositioning techniques (listed below) are a superior option for management Delia Heady et al., 2008).    Figure above:  (A) Instructions for the modified Epley procedure (MEP) for left ear posterior canal benign paroxysmal positional vertigo (PC-BPPV). For right ear BPPV, the procedure has to be performed in the opposite direction, starting with the head turned to the right side.  1. Start by sitting on a bed with your head turned 45 to the left. Place a pillow behind you so that on lying back it will be under your shoulders.  2. Lie back quickly with shoulders on the pillow, neck extended, and head resting on the bed. In this position, the affected (left) ear is underneath. Wait for 30 secondS.  3. Turn your head 90 to the right (without raising it), and wait again for 30 seconds.  4. Turn your body and head another 90 to the right, and wait for another 30 seconds.  5. Sit up on the right side. This maneuver should be performed three times a day. Repeat this daily until you are free from positional vertigo for 24 hours.   (B) Instructions for the modified Semont maneuver (MSM) for left ear PC-BPPV. For right ear BPPV, the maneuver has  to be performed in the opposite direction, starting with the head turned toward the left ear.  1. Sit upright on a bed with your head turned 45 toward the right ear.  2. Drop quickly to the left side, so that your head touches the bed behind your left ear. Wait 30 seconds.  3. Move head and trunk in a swift movement toward the other side without stopping in the upright position, so that your head comes to rest on the right side of your forehead. Wait again for 30 seconds.  4. Sit up again.  This maneuver should be performed three times a day. Repeat this daily until you are free from positional vertigo symptoms for 24 hours.   (   See the video in the supplementary material on the NeurologyWeb site; go to http://www.neurology.org/content/63/1/150/F1.expansion.html   )     You can also try this motion at home as  well- Self-Treatment of Benign Paroxysmal Positional Vertigo Benign Paroxysmal Positioning Vertigo is caused by loose inner ear crystals in the inner ear that migrate while sleeping to the back-bottom inner ear balance canal, the so-called "posterior semi-circular canal." The maneuver demonstrated below is the way to reposition the loose crystals so that the symptoms caused by the loose crystals go away. You may have a floating, swaying sense while walking or sitting for a few days after this procedure.

## 2017-08-30 NOTE — Progress Notes (Signed)
Impression and Recommendations:    1. Hypertriglyceridemia   2. Secondary hypertension   3. Eustachian tube dysfunction, bilateral   4. Acute pansinusitis, recurrence not specified     1. Hypertriglyceridemia- Pt is stable and tolerating his medications well. He is compliant and denies any new onset of symptoms.  -Continue meds as prescribed.  -refill fenofibric acid today.  2. Secondary Hypertension- Pt is compliant with his medications and tolerating them well. Continue meds as prescribed.   3. Eustachian tube dysfunction- -Do AYR or neti pot sinus rinses with previously boiled or distilled water twice daily. After sinus rinses, do 1 spray of flonase in each nostril, twice daily.   4. Acute pansinusitis- He declined steroids because of mood-altering side effects.   --Take OTC medications for sinus congestion symptom relief.   -If these symptoms persist over 7-10 days, call the office and we will prescribe abx at that time.  -Follow up in 4-6 months for chronic care and health management. Sooner if problems.    Education and routine counseling performed. Handouts provided.  No orders of the defined types were placed in this encounter.   No orders of the defined types were placed in this encounter.    The patient was counseled, risk factors were discussed, anticipatory guidance given.  Gross side effects, risk and benefits, and alternatives of medications discussed with patient.  Patient is aware that all medications have potential side effects and we are unable to predict every side effect or drug-drug interaction that may occur.  Expresses verbal understanding and consents to current therapy plan and treatment regimen.  No Follow-up on file.  Please see AVS handed out to patient at the end of our visit for further patient instructions/ counseling done pertaining to today's office visit.    Note: This document was prepared using Dragon voice recognition  software and may include unintentional dictation errors.  This document serves as a record of services personally performed by Thomasene Loteborah Sabatino Williard, DO. It was created on her behalf by Thelma BargeNick Cochran, a trained medical scribe. The creation of this record is based on the scribe's personal observations and the provider's statements to them.   I have reviewed the above medical documentation for accuracy and completeness and I concur.  Thomasene LotDeborah Kalyna Paolella 09/05/17 1:31 PM   Subjective:    HPI: Corey Martin is a 42 y.o. male who [facility] for follow up for HTN and acute sinus issues.  HTN:  -  His blood pressure has been controlled at home. His BP range is 120-130s/70-80s at home.  - Patient reports good compliance with blood pressure medications.   - Denies medication S-E.   - Smoking Status noted   - He denies new onset of: chest pain, exercise intolerance, shortness of breath, dizziness, visual changes, headache, lower extremity swelling or claudication.   Sinus He also states he has sinus pressure and congestion for 3-4 days. He has associated HA, mild dizziness with positional change, post-nasal drip and sore throat in the morning. He is feeling better today. He takes tylenol for pain as well as nasal spray. He denies cough, SOB, wheezing, CP, fever, and one-sided facial pain or ear pain.  Last 3 blood pressure readings in our office are as follows: BP Readings from Last 3 Encounters:  08/30/17 120/83  05/17/17 122/84  01/13/17 135/84    Pulse Readings from Last 3 Encounters:  08/30/17 89  05/17/17 94  01/13/17  75    Filed Weights   08/30/17 1037  Weight: (!) 315 lb 3.2 oz (143 kg)      Patient Care Team    Relationship Specialty Notifications Start End  [name], DO PCP - General Family Medicine  07/29/16   Cherly Hensen, MD  Psychiatry  01/13/17    Comment: seeing a doc there- not sure of name     Lab Results    Component Value Date   CREATININE 0.98 01/11/2017   BUN 13 01/11/2017   NA 145 (H) 01/11/2017   K 4.9 01/11/2017   CL 104 01/11/2017   CO2 24 01/11/2017    Lab Results  Component Value Date   CHOL 186 01/11/2017   CHOL 189 08/02/2016   CHOL 169 08/25/2015    Lab Results  Component Value Date   HDL 48 01/11/2017   HDL 48 08/02/2016   HDL 45 08/25/2015    Lab Results  Component Value Date   LDLCALC 117 (H) 01/11/2017   LDLCALC 118 (H) 08/02/2016   LDLCALC 93 08/25/2015    Lab Results  Component Value Date   TRIG 105 01/11/2017   TRIG 113 08/02/2016   TRIG 156 08/25/2015    Lab Results  Component Value Date   CHOLHDL 3.9 01/11/2017   CHOLHDL 3.9 08/02/2016    No results found for: LDLDIRECT ===================================================================   Patient Active Problem List   Diagnosis Date Noted  . Mood disorder (HCC) 01/13/2017  . Elevated LDL cholesterol level 01/13/2017  . Vitamin D deficiency 01/04/2017  . Influenza A 09/03/2016  . HTN (hypertension) 07/29/2016  . Obesity, Class III, BMI 40-49.9 (morbid obesity) (HCC) 07/29/2016  . Hypertriglyceridemia 07/29/2016  . Generalized OA- knee 07/29/2016  . History of meniscal tear- L knee 07/29/2016     Past Medical History:  Diagnosis Date  . Depression   . Hypercholesteremia   . Hypertension      Past Surgical History:  Procedure Laterality Date  . KNEE SURGERY       Family History  Problem Relation Age of Onset  . Hyperlipidemia Mother   . Hypertension Mother   . Heart attack Father   . Hyperlipidemia Father   . Hypertension Father   . Diabetes Maternal Grandmother      Social History   Substance and Sexual Activity  Drug Use No  ,  Social History   Substance and Sexual Activity  Alcohol Use Yes  . Alcohol/week: 1.8 oz  . Types: 1 Glasses of wine, 1 Cans of beer, 1 Shots of liquor per week  ,  Social History   Tobacco Use  Smoking Status Never Smoker   Smokeless Tobacco Never Used  ,    Current Outpatient Medications on File Prior to Visit  Medication Sig Dispense Refill  . amLODipine (NORVASC) 5 MG tablet TAKE 2 TABLETS BY MOUTH EVERY DAY 180 tablet 0  . buPROPion (WELLBUTRIN XL) 300 MG 24 hr tablet TAKE 1 TABLET BY MOUTH EVERY DAY 30 tablet 0  . Cholecalciferol (VITAMIN D3) 5000 units TABS 5,000 IU OTC vitamin D3 daily. 90 tablet 3  . Choline Fenofibrate (FENOFIBRIC ACID) 135 MG CPDR TAKE 1 CAPSULE BY MOUTH EVERY DAY 30 capsule 1  . LamoTRIgine (LAMICTAL PO) Take 100 mg by mouth daily.     Marland Kitchen lisinopril-hydrochlorothiazide (PRINZIDE,ZESTORETIC) 20-12.5 MG tablet Take 1 tablet by mouth daily. 90 tablet 1  . lisinopril-hydrochlorothiazide (PRINZIDE,ZESTORETIC) 20-12.5 MG tablet TAKE 1 TABLET BY MOUTH EVERY DAY 90 tablet  0  . Vitamin D, Ergocalciferol, (DRISDOL) 50000 units CAPS capsule Take 1 capsule (50,000 Units total) by mouth every 7 (seven) days. 12 capsule 3   No current facility-administered medications on file prior to visit.      No Known Allergies   Review of Systems:   General:  Denies fever, chills Optho/Auditory:   Denies visual changes, blurred vision Respiratory:   Denies SOB, cough, wheeze, DIB  Cardiovascular:   Denies chest pain, palpitations, painful respirations Gastrointestinal:   Denies nausea, vomiting, diarrhea.  Endocrine:     Denies new hot or cold intolerance Musculoskeletal:  Denies joint swelling, gait issues, or new unexplained myalgias/ arthralgias Skin:  Denies rash, suspicious lesions  Neurological:    Denies dizziness, unexplained weakness, numbness  Psychiatric/Behavioral:   Denies mood changes  Objective:    Blood pressure 120/83, pulse 89, height 5' 10.25" (1.784 m), weight (!) 315 lb 3.2 oz (143 kg), SpO2 97 %.  Body mass index is 44.91 kg/m.  General: Well Developed, well nourished, and in no acute distress.  HEENT: Normocephalic, atraumatic, pupils equal round reactive to light,  neck supple, No carotid bruits, no JVD. Bilateral TMs slightly protruding, no air fluid levels, not opaque. Non tender sinuses. OP mildly erythematous. Nares- erythematous with scant amount of clear discharge. Lungs normal. No lymphadenopathy. Skin: Warm and dry, cap RF less 2 sec Cardiac: Regular rate and rhythm, S1, S2 WNL's, no murmurs rubs or gallops Respiratory: ECTA B/L, Not using accessory muscles, speaking in full sentences. NeuroM-Sk: Ambulates w/o assistance, moves ext * 4 w/o difficulty, sensation grossly intact.  Ext: scant edema b/l lower ext Psych: No HI/SI, judgement and insight good, Euthymic mood. Full Affect.

## 2017-09-14 DIAGNOSIS — F408 Other phobic anxiety disorders: Secondary | ICD-10-CM | POA: Diagnosis not present

## 2017-09-14 DIAGNOSIS — F3181 Bipolar II disorder: Secondary | ICD-10-CM | POA: Diagnosis not present

## 2017-09-14 DIAGNOSIS — F900 Attention-deficit hyperactivity disorder, predominantly inattentive type: Secondary | ICD-10-CM | POA: Diagnosis not present

## 2017-11-29 DIAGNOSIS — F3181 Bipolar II disorder: Secondary | ICD-10-CM | POA: Diagnosis not present

## 2017-11-29 DIAGNOSIS — F900 Attention-deficit hyperactivity disorder, predominantly inattentive type: Secondary | ICD-10-CM | POA: Diagnosis not present

## 2017-11-29 DIAGNOSIS — F408 Other phobic anxiety disorders: Secondary | ICD-10-CM | POA: Diagnosis not present

## 2017-12-29 ENCOUNTER — Ambulatory Visit: Payer: BLUE CROSS/BLUE SHIELD | Admitting: Family Medicine

## 2017-12-29 ENCOUNTER — Encounter: Payer: Self-pay | Admitting: Family Medicine

## 2017-12-29 ENCOUNTER — Other Ambulatory Visit: Payer: Self-pay | Admitting: Family Medicine

## 2017-12-29 VITALS — BP 119/81 | HR 82 | Ht 70.0 in | Wt 320.5 lb

## 2017-12-29 DIAGNOSIS — E781 Pure hyperglyceridemia: Secondary | ICD-10-CM | POA: Diagnosis not present

## 2017-12-29 DIAGNOSIS — F39 Unspecified mood [affective] disorder: Secondary | ICD-10-CM

## 2017-12-29 DIAGNOSIS — I159 Secondary hypertension, unspecified: Secondary | ICD-10-CM

## 2017-12-29 DIAGNOSIS — E559 Vitamin D deficiency, unspecified: Secondary | ICD-10-CM

## 2017-12-29 DIAGNOSIS — E7800 Pure hypercholesterolemia, unspecified: Secondary | ICD-10-CM | POA: Diagnosis not present

## 2017-12-29 NOTE — Patient Instructions (Addendum)
If you like to change your psychiatrist from [facility], please consider this 1 who is also a psychiatrist: Dr. Milagros Evener at [facility] [place], [place]-  [phone]   Behavior Modification Ideas for Weight Management  Weight management involves adopting a healthy lifestyle that includes a knowledge of nutrition and exercise, a positive attitude and the right kind of motivation. Internal motives such as better health, increased energy, self-esteem and personal control increase your chances of lifelong weight management success.  Remember to have realistic goals and think long-term success. Believe in yourself and you can do it. The following information will give you ideas to help you meet your goals.  Control Your Home Environment  Eat only while sitting down at the kitchen or dining room table. Do not eat while watching television, reading, cooking, talking on the phone, standing at the refrigerator or working on the computer. Keep tempting foods out of the house -- don't buy them. Keep tempting foods out of sight. Have low-calorie foods ready to eat. Unless you are preparing a meal, stay out of the kitchen. Have healthy snacks at your disposal, such as small pieces of fruit, vegetables, canned fruit, pretzels, low-fat string cheese and nonfat cottage cheese.  Control Your Work Environment  Do not eat at [employer] or keep tempting snacks at your desk. If you get hungry between meals, plan healthy snacks and bring them with you to work. During your breaks, go for a walk instead of eating. If you work around food, plan in advance the one item you will eat at mealtime. Make it inconvenient to nibble on food by chewing gum, sugarless candy or drinking water or another low-calorie beverage. Do not work through meals. Skipping meals slows down metabolism and may result in overeating at the next meal. If food is available for special occasions, either pick the  healthiest item, nibble on low-fat snacks brought from home, don't have anything offered, choose one option and have a small amount, or have only a beverage.  Control Your Mealtime Environment  Serve your plate of food at the stove or kitchen counter. Do not put the serving dishes on the table. If you do put dishes on the table, remove them immediately when finished eating. Fill half of your plate with vegetables, a quarter with lean protein and a quarter with starch. Use smaller plates, bowls and glasses. A smaller portion will look large when it is in a little dish. Politely refuse second helpings. When fixing your plate, limit portions of food to one scoop/serving or less.   Daily Food Management  Replace eating with another activity that you will not associate with food. Wait 20 minutes before eating something you are craving. Drink a large glass of water or diet soda before eating. Always have a big glass or bottle of water to drink throughout the day. Avoid high-calorie add-ons such as cream with your coffee, butter, mayonnaise and salad dressings.  Shopping: Do not shop when hungry or tired. Shop from a list and avoid buying anything that is not on your list. If you must have tempting foods, buy individual-sized packages and try to find a lower-calorie alternative. Don't taste test in the store. Read food labels. Compare products to help you make the healthiest choices.  Preparation: Chew a piece of gum while cooking meals. Use a quarter teaspoon if you taste test your food. Try to only fix what you are going to eat, leaving yourself no chance for  seconds. If you have prepared more food than you need, portion it into individual containers and freeze or refrigerate immediately. Don't snack while cooking meals.  Eating: Eat slowly. Remember it takes about 20 minutes for your stomach to send a message to your brain that it is full. Don't let fake hunger make you think you need  more. The ideal way to eat is to take a bite, put your utensil down, take a sip of water, cut your next bite, take a bit, put your utensil down and so on. Do not cut your food all at one time. Cut only as needed. Take small bites and chew your food well. Stop eating for a minute or two at least once during a meal or snack. Take breaks to reflect and have conversation.  Cleanup and Leftovers: Label leftovers for a specific meal or snack. Freeze or refrigerate individual portions of leftovers. Do not clean up if you are still hungry.  Eating Out and Social Eating  Do not arrive hungry. Eat something light before the meal. Try to fill up on low-calorie foods, such as vegetables and fruit, and eat smaller portions of the high-calorie foods. Eat foods that you like, but choose small portions. If you want seconds, wait at least 20 minutes after you have eaten to see if you are actually hungry or if your eyes are bigger than your stomach. Limit alcoholic beverages. Try a soda water with a twist of lime. Do not skip other meals in the day to save room for the special event.  At Restaurants: Order  la carte rather than buffet style. Order some vegetables or a salad for an appetizer instead of eating bread. If you order a high-calorie dish, share it with someone. Try an after-dinner mint with your coffee. If you do have dessert, share it with two or more people. Don't overeat because you do not want to waste food. Ask for a doggie bag to take extra food home. Tell the server to put half of your entree in a to go bag before the meal is served to you. Ask for salad dressing, gravy or high-fat sauces on the side. Dip the tip of your fork in the dressing before each bite. If bread is served, ask for only one piece. Try it plain without butter or oil. At [employer] where oil and vinegar is served with bread, use only a small amount of oil and a lot of vinegar for dipping.  At a Friend's  House: Offer to bring a dish, appetizer or dessert that is low in calories. Serve yourself small portions or tell the host that you only want a small amount. Stand or sit away from the snack table. Stay away from the kitchen or stay busy if you are near the food. Limit your alcohol intake.  At [employer] and Cafeterias: Cover most of your plate with lettuce and/or vegetables. Use a salad plate instead of a dinner plate. After eating, clear away your dishes before having coffee or tea.  Entertaining at Home: Explore low-fat, low-cholesterol cookbooks. Use single-serving foods like chicken breasts or hamburger patties. Prepare low-calorie appetizers and desserts.   Holidays: Keep tempting foods out of sight. Decorate the house without using food. Have low-calorie beverages and foods on hand for guests. Allow yourself one planned treat a day. Don't skip meals to save up for the holiday feast. Eat regular, planned meals.   Exercise Well  Make exercise a priority and a planned activity in the  day. If possible, walk the entire or part of the distance to work. Get an exercise buddy. Go for a walk with a colleague during one of your breaks, go to the gym, run or take a walk with a friend, walk in the mall with a shopping companion. Park at the end of the parking lot and walk to the store or office entrance. Always take the stairs all of the way or at least part of the way to your floor. If you have a desk job, walk around the office frequently. Do leg lifts while sitting at your desk. Do something outside on the weekends like going for a hike or a bike ride.   Have a Healthy Attitude  Make health your weight management priority. Be realistic. Have a goal to achieve a healthier you, not necessarily the lowest weight or ideal weight based on calculations or tables. Focus on a healthy eating style, not on dieting. Dieting usually lasts for a short amount of time and rarely produces  long-term success. Think long term. You are developing new healthy behaviors to follow next month, in a year and in a decade.    This information is for educational purposes only and is not intended to replace the advice of your doctor or health care provider. We encourage you to discuss with your doctor any questions or concerns you may have.        Guidelines for Losing Weight   We want weight loss that will last so you should lose 1-2 pounds a week.  THAT IS IT! Please pick THREE things a month to change. Once it is a habit check off the item. Then pick another three items off the list to become habits.  If you are already doing a habit on the list GREAT!  Cross that item off!  Dont drink your calories. Ie, alcohol, soda, fruit juice, and sweet tea.   Drink more water. Drink a glass when you feel hungry or before each meal.   Eat breakfast - Complex carb and protein (likeDannon light and fit yogurt, oatmeal, fruit, eggs, [place] bacon).  Measure your cereal.  Eat no more than one cup a day. (ie Kashi)  Eat an apple a day.  Add a vegetable a day.  Try a new vegetable a month.  Use Pam! Stop using oil or butter to cook.  Dont finish your plate or use smaller plates.  Share your dessert.  Eat sugar free Jello for dessert or frozen grapes.  Dont eat 2-3 hours before bed.  Switch to whole wheat bread, pasta, and brown rice.  Make healthier choices when you eat out. No fries!  Pick baked chicken, NOT fried.  Dont forget to SLOW DOWN when you eat. It is not going anywhere.   Take the stairs.  Park far away in the parking lot  Lift soup cans (or weights) for 10 minutes while watching TV.  Walk at work for 10 minutes during break.  Walk outside 1 time a week with your friend, kids, dog, or significant other.  Start a walking group at church.  Walk the mall as much as you can tolerate.   Keep a food diary.  Weigh yourself daily.  Walk for 15 minutes 3  days per week.  Cook at home more often and eat out less. If life happens and you go back to old habits, it is okay.  Just start over. You can do it!  If you experience chest pain, get  short of breath, or tired during the exercise, please stop immediately and inform your doctor.    Before you even begin to attack a weight-loss plan, it pays to remember this: You are not fat. You have fat. Losing weight isn't about blame or shame; it's simply another achievement to accomplish. Dieting is like any other skill--you have to buckle down and work at it. As long as you act in a smart, reasonable way, you'll ultimately get where you want to be. Here are some weight loss pearls for you.   1. It's Not a Diet. It's a Lifestyle Thinking of a diet as something you're on and suffering through only for the short term doesn't work. To shed weight and keep it off, you need to make permanent changes to the way you eat. It's OK to indulge occasionally, of course, but if you cut calories temporarily and then revert to your old way of eating, you'll gain back the weight quicker than you can say yo-yo. Use it to lose it. Research shows that one of the best predictors of long-term weight loss is how many pounds you drop in the first month. For that reason, nutritionists often suggest being stricter for the first two weeks of your new eating strategy to build momentum. Cut out added sugar and alcohol and avoid unrefined carbs. After that, figure out how you can reincorporate them in a way that's healthy and maintainable.  2. There's a Right Way to Exercise Working out burns calories and fat and boosts your metabolism by building muscle. But those trying to lose weight are notorious for overestimating the number of calories they burn and underestimating the amount they take in. Unfortunately, your system is biologically programmed to hold on to extra pounds and that means when you start exercising, your body senses the deficit  and ramps up its hunger signals. If you're not diligent, you'll eat everything you burn and then some. Use it, to lose it. Cardio gets all the exercise glory, but strength and interval training are the real heroes. They help you build lean muscle, which in turn increases your metabolism and calorie-burning ability 3. Don't Overreact to Mild Hunger Some people have a hard time losing weight because of hunger anxiety. To them, being hungry is bad--something to be avoided at all costs--so they carry snacks with them and eat when they don't need to. Others eat because they're stressed out or bored. While you never want to get to the point of being ravenous (that's when bingeing is likely to happen), a hunger pang, a craving, or the fact that it's 3:00 p.m. should not send you racing for the vending machine or obsessing about the energy bar in your purse. Ideally, you should put off eating until your stomach is growling and it's difficult to concentrate.  Use it to lose it. When you feel the urge to eat, use the HALT method. Ask yourself, Am I really hungry? Or am I angry or anxious, lonely or bored, or tired? If you're still not certain, try the apple test. If you're truly hungry, an apple should seem delicious; if it doesn't, something else is going on. Or you can try drinking water and making yourself busy, if you are still hungry try a healthy snack.  4. Not All Calories Are Created Equal The mechanics of weight loss are pretty simple: Take in fewer calories than you use for energy. But the kind of food you eat makes all the difference. Processed food that's  high in saturated fat and refined starch or sugar can cause inflammation that disrupts the hormone signals that tell your brain you're full. The result: You eat a lot more.  Use it to lose it. Clean up your diet. Swap in whole, unprocessed foods, including vegetables, lean protein, and healthy fats that will fill you up and give you the biggest nutritional  bang for your calorie buck. In a few weeks, as your brain starts receiving regular hunger and fullness signals once again, you'll notice that you feel less hungry overall and naturally start cutting back on the amount you eat.  5. Protein, Produce, and Plant-Based Fats Are Your Weight-Loss Trinity Here's why eating the three Ps regularly will help you drop pounds. Protein fills you up. You need it to build lean muscle, which keeps your metabolism humming so that you can torch more fat. People in a weight-loss program who ate double the recommended daily allowance for protein (about 110 grams for a 150-pound woman) lost 70 percent of their weight from fat, while people who ate the RDA lost only about 40 percent, one study found. Produce is packed with filling fiber. "It's very difficult to consume too many calories if you're eating a lot of vegetables. Example: Three cups of broccoli is a lot of food, yet only 93 calories. (Fruit is another story. It can be easy to overeat and can contain a lot of calories from sugar, so be sure to monitor your intake.) Plant-based fats like olive oil and those in avocados and nuts are healthy and extra satiating.  Use it to lose it. Aim to incorporate each of the three Ps into every meal and snack. People who eat protein throughout the day are able to keep weight off, according to a study in the American Journal of Clinical Nutrition. In addition to meat, poultry and seafood, good sources are beans, lentils, eggs, tofu, and yogurt. As for fat, keep portion sizes in check by measuring out salad dressing, oil, and nut butters (shoot for one to two tablespoons). Finally, eat veggies or a little fruit at every meal. People who did that consumed 308 fewer calories but didn't feel any hungrier than when they didn't eat more produce.  7. How You Eat Is As Important As What You Eat In order for your brain to register that you're full, you need to focus on what you're eating. Sit  down whenever you eat, preferably at a table. Turn off the TV or computer, put down your phone, and look at your food. Smell it. Chew slowly, and don't put another bite on your fork until you swallow. When women ate lunch this attentively, they consumed 30 percent less when snacking later than those who listened to an audiobook at lunchtime, according to a study in the [place] Journal of Nutrition. 8. Weighing Yourself Really Works The scale provides the best evidence about whether your efforts are paying off. Seeing the numbers tick up or down or stagnate is motivation to keep going--or to rethink your approach. A 2015 study at [facility] found that daily weigh-ins helped people lose more weight, keep it off, and maintain that loss, even after two years. Use it to lose it. Step on the scale at the same time every day for the best results. If your weight shoots up several pounds from one weigh-in to the next, don't freak out. Eating a lot of salt the night before or having your period is the likely culprit. The number should  return to normal in a day or two. It's a steady climb that you need to do something about. 9. Too Much Stress and Too Little Sleep Are Your Enemies When you're tired and frazzled, your body cranks up the production of cortisol, the stress hormone that can cause carb cravings. Not getting enough sleep also boosts your levels of ghrelin, a hormone associated with hunger, while suppressing leptin, a hormone that signals fullness and satiety. People on a diet who slept only five and a half hours a night for two weeks lost 55 percent less fat and were hungrier than those who slept eight and a half hours, according to a study in the [place] [employer] Journal. Use it to lose it. Prioritize sleep, aiming for seven hours or more a night, which research shows helps lower stress. And make sure you're getting quality zzz's. If a snoring spouse or a fidgety cat wakes you up  frequently throughout the night, you may end up getting the equivalent of just four hours of sleep, according to a study from [facility] [employer]. Keep pets out of the bedroom, and use a white-noise app to drown out snoring. 10. You Will Hit a plateau--And You Can Bust Through It As you slim down, your body releases much less leptin, the fullness hormone.  If you're not strength training, start right now. Building muscle can raise your metabolism to help you overcome a plateau. To keep your body challenged and burning calories, incorporate new moves and more intense intervals into your workouts or add another sweat session to your weekly routine. Alternatively, cut an extra 100 calories or so a day from your diet. Now that you've lost weight, your body simply doesn't need as much fuel.    Since food equals calories, in order to lose weight you must either eat fewer calories, exercise more to burn off calories with activity, or both. Food that is not used to fuel the body is stored as fat. A major component of losing weight is to make smarter food choices. Here's how:  1)   Limit non-nutritious foods, such as: Sugar, honey, syrups and candy Pastries, donuts, pies, cakes and cookies Soft drinks, sweetened juices and alcoholic beverages  2)  Cut down on high-fat foods by: - Choosing poultry, fish or lean red meat - Choosing low-fat cooking methods, such as baking, broiling, steaming, grilling and boiling - Using low-fat or non-fat dairy products - Using vinaigrette, herbs, lemon or fat-free salad dressings - Avoiding fatty meats, such as bacon, sausage, franks, ribs and luncheon meats - Avoiding high-fat snacks like nuts, chips and chocolate - Avoiding fried foods - Using less butter, margarine, oil and mayonnaise - Avoiding high-fat gravies, cream sauces and cream-based soups  3) Eat a variety of foods, including: - Fruit and vegetables that are raw, steamed or baked - Whole grains, breads,  cereal, rice and pasta - Dairy products, such as low-fat or non-fat milk or yogurt, low-fat cottage cheese and low-fat cheese - Protein-rich foods like chicken, [place]key, fish, lean meat and legumes, or beans  4) Change your eating habits by: - Eat three balanced meals a day to help control your hunger - Watch portion sizes and eat small servings of a variety of foods - Choose low-calorie snacks - Eat only when you are hungry and stop when you are satisfied - Eat slowly and try not to perform other tasks while eating - Find other activities to distract you from food, such as walking, taking  up a hobby or being involved in the community - Include regular exercise in your daily routine ( minimum of 20 min of moderate-intensity exercise at least 5 days/week)  - Find a support group, if necessary, for emotional support in your weight loss journey           Easy ways to cut 100 calories   1. Eat your eggs with hot sauce OR salsa instead of cheese.  Eggs are great for breakfast, but many people consider eggs and cheese to be BFFs. Instead of cheese--1 oz. of cheddar has 114 calories--top your eggs with hot sauce, which contains no calories and helps with satiety and metabolism. Salsa is also a great option!!  2. Top your toast, waffles or pancakes with fresh berries instead of jelly or syrup. Half a cup of berries--fresh, frozen or thawed--has about 40 calories, compared with 2 tbsp. of maple syrup or jelly, which both have about 100 calories. The berries will also give you a good punch of fiber, which helps keep you full and satisfied and wont spike blood sugar quickly like the jelly or syrup. 3. Swap the non-fat latte for black coffee with a splash of half-and-half. Contrary to its name, that non-fat latte has 130 calories and a startling 19g of carbohydrates per 16 oz. serving. Replacing that light drinkable dessert with a black coffee with a splash of half-and-half saves you more than  100 calories per 16 oz. serving. 4. Sprinkle salads with freeze-dried raspberries instead of dried cranberries. If you want a sweet addition to your nutritious salad, stay away from dried cranberries. They have a whopping 130 calories per  cup and 30g carbohydrates. Instead, sprinkle freeze-dried raspberries guilt-free and save more than 100 calories per  cup serving, adding 3g of belly-filling fiber. 5. Go for mustard in place of mayo on your sandwich. Mustard can add really nice flavor to any sandwich, and there are tons of varieties, from spicy to honey. A serving of mayo is 95 calories, versus 10 calories in a serving of mustard.  Or try an avocado mayo spread: You can find the recipe few click this link: https://www.californiaavocado.com/recipes/recipe-container/california-avocado-mayo 6. Choose a DIY salad dressing instead of the store-bought kind. Mix Dijon or whole grain mustard with low-fat Kefir or red wine vinegar and garlic. 7. Use hummus as a spread instead of a dip. Use hummus as a spread on a high-fiber cracker or tortilla with a sandwich and save on calories without sacrificing taste. 8. Pick just one salad accessory. Salad isnt automatically a calorie winner. Its easy to over-accessorize with toppings. Instead of topping your salad with nuts, avocado and cranberries (all three will clock in at 313 calories), just pick one. The next day, choose a different accessory, which will also keep your salad interesting. You dont wear all your jewelry every day, right? 9. Ditch the white pasta in favor of spaghetti squash. One cup of cooked spaghetti squash has about 40 calories, compared with traditional spaghetti, which comes with more than 200. Spaghetti squash is also nutrient-dense. Its a good source of fiber and Vitamins A and C, and it can be eaten just like you would eat pasta--with a great tomato sauce and [place] meatballs or with pesto, tofu and spinach, for example. 10. Dress  up your chili, soups and stews with non-fat [place] yogurt instead of sour cream. Just a dollop of sour cream can set you back 115 calories and a whopping 12g of fat--seven of which are of the  artery-clogging variety. Added bonus: [place] yogurt is packed with muscle-building protein, calcium and B Vitamins. 11. Mash cauliflower instead of mashed potatoes. One cup of traditional mashed potatoes--in all their creamy goodness--has more than 200 calories, compared to mashed cauliflower, which you can typically eat for less than 100 calories per 1 cup serving. Cauliflower is a great source of the antioxidant indole-3-carbinol (I3C), which may help reduce the risk of some cancers, like breast cancer. 12. Ditch the ice cream sundae in favor of a [place] yogurt parfait. Instead of a cup of ice cream or fro-yo for dessert, try 1 cup of nonfat Greek yogurt topped with fresh berries and a sprinkle of cacao nibs. Both toppings are packed with antioxidants, which can help reduce cellular inflammation and oxidative damage. And the comparison is a no-brainer: One cup of ice cream has about 275 calories; one cup of frozen yogurt has about 230; and a cup of Greek yogurt has just 130, plus twice the protein, so youre less likely to return to the freezer for a second helping. 13. Put olive oil in a spray container instead of using it directly from the bottle. Each tablespoon of olive oil is 120 calories and 15g of fat. Use a mister instead of pouring it straight into the pan or onto a salad. This allows for portion control and will save you more than 100 calories. 14. When baking, substitute canned pumpkin for butter or oil. Canned pumpkin--not pumpkin pie mix--is loaded with Vitamin A, which is important for skin and eye health, as well as immunity. And the comparisons are pretty crazy:  cup of canned pumpkin has about 40 calories, compared to butter or oil, which has more than 800 calories. Yes, 800 calories. Applesauce and  mashed banana can also serve as good substitutions for butter or oil, usually in a 1:1 ratio. 15. Top casseroles with high-fiber cereal instead of breadcrumbs. Breadcrumbs are typically made with white bread, while breakfast cereals contain 5-9g of fiber per serving. Not only will you save more than 150 calories per  cup serving, the swap will also keep you more full and youll get a metabolism boost from the added fiber. 16. Snack on pistachios instead of macadamia nuts. Believe it or not, you get the same amount of calories from 35 pistachios (100 calories) as you would from only five macadamia nuts. 17. Chow down on kale chips rather than potato chips. This is my favorite dont knock it till you try it swap. Kale chips are so easy to make at home, and you can spice them up with a little grated parmesan or chili powder. Plus, theyre a mere fraction of the calories of potato chips, but with the same crunch factor we crave so often. 18. Add seltzer and some fruit slices to your cocktail instead of soda or fruit juice. One cup of soda or fruit juice can pack on as much as 140 calories. Instead, use seltzer and fruit slices. The fruit provides valuable phytochemicals, such as flavonoids and anthocyanins, which help to combat cancer and stave off the aging process.

## 2017-12-29 NOTE — Progress Notes (Signed)
Impression and Recommendations:    1. Hypertriglyceridemia   2. Secondary hypertension   3. Obesity, Class III, BMI 40-49.9 (morbid obesity) (HCC)   4. Mood disorder (HCC)   5. Vitamin D deficiency   6. Elevated LDL cholesterol level     1. HTG/HLD/elevated LDL cholesterol level. -continue meds.  -Reduce intake of saturated and trans fats, and fatty carbohydrates.  -Check labs in near future. -Drink adequate amounts of water per day, equal to half of your wt in oz.  2. Secondary HTN -BP well controlled at this time. Sx stable. -continue meds. -check your BP at home and keep a log. Bring this into next OV.   3. Obesity -Referral given for wt loss clinic. -recommend and encouraged wt loss.   4. Mood disorder -stable at this time.  -followed by psychiatrist, Dr. Quintella Reichert. -Continue meds per psych.  5. Vit D deficiency -Continue supplementation. Check labs at next CPE.  -Check fasting labs in near future when due for CPE.  Orders Placed This Encounter  Procedures  . Amb Ref to Medical Weight Management    No orders of the defined types were placed in this encounter.   Gross side effects, risk and benefits, and alternatives of medications and treatment plan in general discussed with patient.  Patient is aware that all medications have potential side effects and we are unable to predict every side effect or drug-drug interaction that may occur.   Patient will call with any questions prior to using medication if they have concerns.  Expresses verbal understanding and consents to current therapy and treatment regimen.  No barriers to understanding were identified.  Red flag symptoms and signs discussed in detail.  Patient expressed understanding regarding what to do in case of emergency\urgent symptoms  Please see AVS handed out to patient at the end of our visit for further patient instructions/ counseling done pertaining to today's office visit.   Return for CPE/ yrly  physical, come fasting near future..    Note: This note was prepared with assistance of Dragon voice recognition software. Occasional wrong-word or sound-a-like substitutions may have occurred due to the inherent limitations of voice recognition software.   This document serves as a record of services personally performed by Corey Lot, DO. It was created on her behalf by Thelma Barge, a trained medical scribe. The creation of this record is based on the scribe's personal observations and the provider's statements to them.   I have reviewed the above medical documentation for accuracy and completeness and I concur.  Corey Martin 12/29/17 10:53 AM  --------------------------------------------------------------------------------------------------------------------------------------------------------------------------------------------------------------------------------------------    Subjective:     HPI: Corey Martin is a 42 y.o. male who presents to [facility] today for issues as discussed below including wt loss, HTN, HTG.  Diet/exercise More exercise but this is not going well for him at the moment. He has very low energy.   Mood Being followed by psychiatrist under Dr. Quintella Reichert who fills his wellbutrin and lamictal.    HTN HPI:  -  His blood pressure has been controlled at home.  Pt is checking it at home.   - Patient reports good compliance with blood pressure medications.     - Denies medication S-E   - Smoking Status noted   - He denies new onset of: chest pain, exercise intolerance, shortness of breath, dizziness, visual changes, headache, lower extremity swelling or claudication.  Last 3 blood pressure readings in our  office are as follows: BP Readings from Last 3 Encounters:  12/29/17 119/81  08/30/17 120/83  05/17/17 122/84    Filed Weights   12/29/17 0945  Weight: (!) 320 lb 8 oz (145.4 kg)    Wt Readings from Last 3  Encounters:  12/29/17 (!) 320 lb 8 oz (145.4 kg)  08/30/17 (!) 315 lb 3.2 oz (143 kg)  05/17/17 (!) 318 lb 8 oz (144.5 kg)   BP Readings from Last 3 Encounters:  12/29/17 119/81  08/30/17 120/83  05/17/17 122/84   Pulse Readings from Last 3 Encounters:  12/29/17 82  08/30/17 89  05/17/17 94   BMI Readings from Last 3 Encounters:  12/29/17 45.99 kg/m  08/30/17 44.91 kg/m  05/17/17 45.38 kg/m     Patient Care Team    Relationship Specialty Notifications Start End  Corey Lotpalski, Chisum Habenicht, DO PCP - General Family Medicine  07/29/16   Cherly HensenAiken, Christopher B, MD  Psychiatry  01/13/17    Comment: seeing a doc there- not sure of name     Patient Active Problem List   Diagnosis Date Noted  . Mood disorder (HCC) 01/13/2017    Priority: High  . HTN (hypertension) 07/29/2016    Priority: High  . Obesity, Class III, BMI 40-49.9 (morbid obesity) (HCC) 07/29/2016    Priority: High  . Hypertriglyceridemia 07/29/2016    Priority: High  . Generalized OA- knee 07/29/2016    Priority: Medium  . Secondary hypertension 12/29/2017  . Elevated LDL cholesterol level 01/13/2017  . Vitamin D deficiency 01/04/2017  . Influenza A 09/03/2016  . History of meniscal tear- L knee 07/29/2016    Past Medical history, Surgical history, Family history, Social history, Allergies and Medications have been entered into the medical record, reviewed and changed as needed.    Current Meds  Medication Sig  . buPROPion (WELLBUTRIN XL) 300 MG 24 hr tablet TAKE 1 TABLET BY MOUTH EVERY DAY  . Cholecalciferol (VITAMIN D3) 5000 units TABS 5,000 IU OTC vitamin D3 daily.  . Choline Fenofibrate (FENOFIBRIC ACID) 135 MG CPDR Take 1 tablet by mouth daily.  . LamoTRIgine (LAMICTAL PO) Take 100 mg by mouth daily.   Marland Kitchen. lisinopril-hydrochlorothiazide (PRINZIDE,ZESTORETIC) 20-12.5 MG tablet Take 1 tablet by mouth daily.  . Vitamin D, Ergocalciferol, (DRISDOL) 50000 units CAPS capsule Take 1 capsule (50,000 Units total) by  mouth every 7 (seven) days.  . [DISCONTINUED] amLODipine (NORVASC) 5 MG tablet TAKE 2 TABLETS BY MOUTH EVERY DAY    Allergies:  No Known Allergies   Review of Systems:  A fourteen system review of systems was performed and found to be positive as per HPI.   Objective:   Blood pressure 119/81, pulse 82, height 5\' 10"  (1.778 m), weight (!) 320 lb 8 oz (145.4 kg), SpO2 98 %. Body mass index is 45.99 kg/m. General:  Well Developed, well nourished, appropriate for stated age.  Neuro:  Alert and oriented,  extra-ocular muscles intact  HEENT:  Normocephalic, atraumatic, neck supple, no carotid bruits appreciated  Skin:  no gross rash, warm, pink. Cardiac:  RRR, S1 S2 Respiratory:  ECTA B/L and A/P, Not using accessory muscles, speaking in full sentences- unlabored. Vascular:  Ext warm, no cyanosis apprec.; cap RF less 2 sec. Psych:  No HI/SI, judgement and insight good, Euthymic mood. Full Affect.

## 2018-01-20 ENCOUNTER — Telehealth: Payer: Self-pay | Admitting: Family Medicine

## 2018-01-20 NOTE — Telephone Encounter (Signed)
Patient called states [facility]17 but no further communication from them since then.   --- Patient ask for us to contact him after speaking with them as to why no call back since 6/6 to get him an appointment.   Forwarding message to referral coordinator.   --glh

## 2018-01-21 ENCOUNTER — Other Ambulatory Visit: Payer: Self-pay | Admitting: Family Medicine

## 2018-01-21 DIAGNOSIS — I159 Secondary hypertension, unspecified: Secondary | ICD-10-CM

## 2018-02-13 ENCOUNTER — Ambulatory Visit (INDEPENDENT_AMBULATORY_CARE_PROVIDER_SITE_OTHER): Payer: BLUE CROSS/BLUE SHIELD | Admitting: Family Medicine

## 2018-02-13 ENCOUNTER — Encounter: Payer: Self-pay | Admitting: Family Medicine

## 2018-02-13 VITALS — BP 121/82 | HR 81 | Ht 70.0 in | Wt 328.2 lb

## 2018-02-13 DIAGNOSIS — E781 Pure hyperglyceridemia: Secondary | ICD-10-CM | POA: Diagnosis not present

## 2018-02-13 DIAGNOSIS — E559 Vitamin D deficiency, unspecified: Secondary | ICD-10-CM | POA: Diagnosis not present

## 2018-02-13 DIAGNOSIS — E7800 Pure hypercholesterolemia, unspecified: Secondary | ICD-10-CM | POA: Diagnosis not present

## 2018-02-13 DIAGNOSIS — Z719 Counseling, unspecified: Secondary | ICD-10-CM | POA: Diagnosis not present

## 2018-02-13 DIAGNOSIS — Z0000 Encounter for general adult medical examination without abnormal findings: Secondary | ICD-10-CM

## 2018-02-13 NOTE — Progress Notes (Signed)
Male physical  Impression and Recommendations:    1. Encounter for wellness examination   2. Health education/counseling   3. Obesity, Class III, BMI 40-49.9 (morbid obesity) (HCC)   4. Elevated LDL cholesterol level   5. Hypertriglyceridemia   6. Vitamin D deficiency     1) Anticipatory Guidance: Discussed importance of wearing a seatbelt while driving, not texting while driving;   sunscreen when outside along with skin surveillance; eating a balanced and modest diet; physical activity at least 25 minutes per day or 150 min/ week moderate to intense activity.  2) Immunizations / Screenings / Labs:  All immunizations are up-to-date per recommendations or will be updated today. Patient is due for dental and vision screens which pt will schedule independently. Will obtain CBC, CMP, HgA1c, Lipid panel, TSH and vit D when fasting, if not already done recently.   - Reviewed with patient today the need and proper practice for self-testicular exams.  3) Weight:  BMI meaning discussed with patient.  Discussed goal of losing 5-10% of current body weight which would improve overall feelings of well being and improve objective health data. Improve nutrient density of diet through increasing intake of fruits and vegetables and decreasing saturated fats, white flour products and refined sugars.   BMI Counseling Explained to patient what BMI refers to, and what it means medically.    Told patient to think about it as a "medical risk stratification measurement" and how increasing BMI is associated with increasing risk/ or worsening state of various diseases such as hypertension, hyperlipidemia, diabetes, premature OA, depression etc.  American Heart Association guidelines for healthy diet, basically Mediterranean diet, and exercise guidelines of 30 minutes 5 days per week or more discussed in detail.  Health counseling performed.  All questions answered.  Lifestyle & Preventative Health  Maintenance - Advised patient to continue working toward exercising to improve overall mental, physical, and emotional health.    - Encouraged patient to engage in daily physical activity/formal exercise.  Recommended that the patient eventually strive for at least 150 minutes of moderate cardiovascular activity per week according to guidelines established by the [facility]   - Healthy dietary habits encouraged, including low-carb, and high amounts of lean protein in diet.   - Patient should also consume adequate amounts of water - half of body weight in oz of water per day.  4) Follow-Up: - Patient is fasting today; full fasting blood work drawn today.  - Advised patient to continue to return for regularly scheduled chronic follow-up.  He knows he may return for acute concerns PRN.  - Patient will call his insurance to assess which providers are available for his desired specialty follow up, including weight loss.  Patient declines referral to nutrition / dietitian at this time.   Orders Placed This Encounter  Procedures  . CBC with Differential/Platelet  . Comprehensive metabolic panel  . TSH  . VITAMIN D 25 Hydroxy (Vit-D Deficiency, Fractures)  . T4, free  . Lipid panel  . Hemoglobin A1c    No orders of the defined types were placed in this encounter.    Gross side effects, risk and benefits, and alternatives of medications discussed with patient.  Patient is aware that all medications have potential side effects and we are unable to predict every side effect or drug-drug interaction that may occur.  Expresses verbal understanding and consents to current therapy plan and treatment regimen.  Please see AVS handed out to patient at the end of  our visit for further patient instructions/ counseling done pertaining to today's office visit.  Follow-up preventative CPE in 1 year. Follow-up office visit pending lab work.  F/up sooner for chronic care management and/or prn  This document  serves as a record of services personally performed by Corey Loteborah Lysa Livengood, DO. It was created on her behalf by Peggye FothergillKatherine Galloway, a trained medical scribe. The creation of this record is based on the scribe's personal observations and the provider's statements to them.   I have reviewed the above medical documentation for accuracy and completeness and I concur.  Corey Martin 02/13/18 10:15 AM     Subjective:    CC: CPE  HPI: Corey Martin is a 42 y.o. male who [facility] today for a yearly health maintenance exam.     Health Maintenance Summary Reviewed and updated, unless pt declines services.  Colonoscopy:  Patient is too young (turning 42 in 4 days). Tobacco History Reviewed:   Yes, never smoker. Alcohol:    No concerns, no excessive use Exercise Habits:  Not meeting AHA guidelines. STD concerns:   none Drug Use:   None Birth control method:   n/a Testicular/penile concerns:  Denies issues.  Patient notes that he's been feeling okay.  [place] is going to [place] next [place].  Patient denies any new concerns, new symptoms, new skin lesions, or other issues today.  Denies new chest pain, SOB, dizziness, or other new concerning symptoms. Denies nausea, vomiting, constipation, intolerances to food.  Denies family history of colon cancer or other unusual illness.  Dental Health Can't remember the last time he visited the dentist, but notes he has been since moving here.  Notes he's supposed to go for follow-up every six months, but hasn't found a regular dentist here yet due to insurance.  Visual Health Patient wears contacts, and therefore obtains regular vision screenings/dilated eye exams every year.  Weight Loss Notes he's gained a little more weight on the scale here.  Patient desires to consult with [place] for weight loss, but states that it has been two months since he's heard [facility].    Immunization History  Administered Date(s) Administered  . Influenza,inj,Quad PF,6+ Mos 05/17/2017    Health Maintenance  Topic Date Due  . TETANUS/TDAP  02/17/1997  . HIV Screening  01/13/2029 (Originally 02/17/1993)  . INFLUENZA VACCINE  02/23/2018      Wt Readings from Last 3 Encounters:  02/13/18 (!) 328 lb 3.2 oz (148.9 kg)  12/29/17 (!) 320 lb 8 oz (145.4 kg)  08/30/17 (!) 315 lb 3.2 oz (143 kg)   BP Readings from Last 3 Encounters:  02/13/18 121/82  12/29/17 119/81  08/30/17 120/83   Pulse Readings from Last 3 Encounters:  02/13/18 81  12/29/17 82  08/30/17 89    Patient Active Problem List   Diagnosis Date Noted  . Secondary hypertension 12/29/2017  . Mood disorder (HCC) 01/13/2017  . Elevated LDL cholesterol level 01/13/2017  . Vitamin D deficiency 01/04/2017  . Influenza A 09/03/2016  . HTN (hypertension) 07/29/2016  . Obesity, Class III, BMI 40-49.9 (morbid obesity) (HCC) 07/29/2016  . Hypertriglyceridemia 07/29/2016  . Generalized OA- knee 07/29/2016  . History of meniscal tear- L knee 07/29/2016    Past Medical History:  Diagnosis Date  . Depression   . Hypercholesteremia   . Hypertension     Past Surgical History:  Procedure Laterality Date  . KNEE SURGERY  Family History  Problem Relation Age of Onset  . Hyperlipidemia Mother   . Hypertension Mother   . Heart attack Father   . Hyperlipidemia Father   . Hypertension Father   . Diabetes Maternal Grandmother     Social History   Substance and Sexual Activity  Drug Use No  ,  Social History   Substance and Sexual Activity  Alcohol Use Yes  . Alcohol/week: 1.8 oz  . Types: 1 Glasses of wine, 1 Cans of beer, 1 Shots of liquor per week  ,  Social History   Tobacco Use  Smoking Status Never Smoker  Smokeless Tobacco Never Used  ,  Social History   Substance and Sexual Activity  Sexual Activity Yes  . Birth control/protection: Condom    Patient's  Medications  New Prescriptions   No medications on file  Previous Medications   AMLODIPINE (NORVASC) 5 MG TABLET    TAKE 2 TABLETS BY MOUTH EVERY DAY   BUPROPION (WELLBUTRIN XL) 300 MG 24 HR TABLET    TAKE 1 TABLET BY MOUTH EVERY DAY   CHOLECALCIFEROL (VITAMIN D3) 5000 UNITS TABS    5,000 IU OTC vitamin D3 daily.   CHOLINE FENOFIBRATE (FENOFIBRIC ACID) 135 MG CPDR    Take 1 tablet by mouth daily.   LAMOTRIGINE (LAMICTAL PO)    Take 100 mg by mouth daily.    LISINOPRIL-HYDROCHLOROTHIAZIDE (PRINZIDE,ZESTORETIC) 20-12.5 MG TABLET    Take 1 tablet by mouth daily.   VITAMIN D, ERGOCALCIFEROL, (DRISDOL) 50000 UNITS CAPS CAPSULE    Take 1 capsule (50,000 Units total) by mouth every 7 (seven) days.  Modified Medications   No medications on file  Discontinued Medications   No medications on file    Patient has no known allergies.  Review of Systems: General:   Denies fever, chills, unexplained weight loss.  Optho/Auditory:   Denies visual changes, blurred vision/LOV Respiratory:   Denies SOB, DOE more than baseline levels.  Cardiovascular:   Denies chest pain, palpitations, new onset peripheral edema  Gastrointestinal:   Denies nausea, vomiting, diarrhea.  Genitourinary: Denies dysuria, freq/ urgency, flank pain or discharge from genitals.  Endocrine:     Denies hot or cold intolerance, polyuria, polydipsia. Musculoskeletal:   Denies unexplained myalgias, joint swelling, unexplained arthralgias, gait problems.  Skin:  Denies rash, suspicious lesions Neurological:     Denies dizziness, unexplained weakness, numbness  Psychiatric/Behavioral:   Denies mood changes, suicidal or homicidal ideations, hallucinations    Objective:     Blood pressure 121/82, pulse 81, height 5\' 10"  (1.778 m), weight (!) 328 lb 3.2 oz (148.9 kg), SpO2 96 %. Body mass index is 47.09 kg/m. General Appearance:    Alert, cooperative, no distress, appears stated age  Head:    Normocephalic, without obvious  abnormality, atraumatic  Eyes:    PERRL, conjunctiva/corneas clear, EOM's intact, fundi    benign, both eyes  Ears:    Normal TM's and external ear canals, both ears  Nose:   Nares normal, septum midline, mucosa normal, no drainage    or sinus tenderness  Throat:   Lips w/o lesion, mucosa moist, and tongue normal; teeth and   gums normal  Neck:   Supple, symmetrical, trachea midline, no adenopathy;    thyroid:  no enlargement/tenderness/nodules; no carotid   bruit or JVD  Back:     Symmetric, no curvature, ROM normal, no CVA tenderness  Lungs:     Clear to auscultation bilaterally, respirations unlabored, no  Wh/ R/ R  Chest Wall:    No tenderness or gross deformity; normal excursion   Heart:    Regular rate and rhythm, S1 and S2 normal, no murmur, rub   or gallop  Abdomen:     Soft, non-tender, bowel sounds active all four quadrants, NO   G/R/R, no masses, no organomegaly  Genitalia:   Ext genitalia: without lesion, no penile rash or discharge, no hernias appreciated  Rectal:   Normal tone, prostate WNL's and equal b/l, no tenderness; guaiac negative stool  Extremities:   Extremities normal, atraumatic, no cyanosis or gross edema  Pulses:   2+ and symmetric all extremities  Skin:   Warm, dry, Skin color, texture, turgor normal, no obvious rashes or lesions  M-Sk:   Ambulates * 4 w/o difficulty, no gross deformities, tone WNL  Neurologic:   CNII-XII intact, normal strength, sensation and reflexes    Throughout Psych:  No HI/SI, judgement and insight good, Euthymic mood. Full Affect.

## 2018-02-13 NOTE — Patient Instructions (Signed)

## 2018-02-14 LAB — LIPID PANEL
CHOL/HDL RATIO: 3.9 ratio (ref 0.0–5.0)
Cholesterol, Total: 209 mg/dL — ABNORMAL HIGH (ref 100–199)
HDL: 53 mg/dL (ref >39)
LDL CALC: 131 mg/dL — AB (ref 0–99)
Triglycerides: 127 mg/dL (ref 0–149)
VLDL CHOLESTEROL CAL: 25 mg/dL (ref 5–40)

## 2018-02-14 LAB — COMPREHENSIVE METABOLIC PANEL
ALK PHOS: 36 IU/L — AB (ref 39–117)
ALT: 36 IU/L (ref 0–44)
AST: 21 IU/L (ref 0–40)
Albumin/Globulin Ratio: 2.2 (ref 1.2–2.2)
Albumin: 4.7 g/dL (ref 3.5–5.5)
BUN/Creatinine Ratio: 14 (ref 9–20)
BUN: 13 mg/dL (ref 6–20)
Bilirubin Total: 0.3 mg/dL (ref 0.0–1.2)
CHLORIDE: 104 mmol/L (ref 96–106)
CO2: 23 mmol/L (ref 20–29)
Calcium: 9.1 mg/dL (ref 8.7–10.2)
Creatinine, Ser: 0.9 mg/dL (ref 0.76–1.27)
GFR calc Af Amer: 124 mL/min/{1.73_m2} (ref >59)
GFR calc non Af Amer: 107 mL/min/{1.73_m2} (ref >59)
GLOBULIN, TOTAL: 2.1 g/dL (ref 1.5–4.5)
GLUCOSE: 84 mg/dL (ref 65–99)
Potassium: 4 mmol/L (ref 3.5–5.2)
SODIUM: 143 mmol/L (ref 134–144)
Total Protein: 6.8 g/dL (ref 6.0–8.5)

## 2018-02-14 LAB — HEMOGLOBIN A1C
ESTIMATED AVERAGE GLUCOSE: 105 mg/dL
Hgb A1c MFr Bld: 5.3 % (ref 4.8–5.6)

## 2018-02-14 LAB — CBC WITH DIFFERENTIAL/PLATELET
BASOS ABS: 0 10*3/uL (ref 0.0–0.2)
Basos: 1 %
EOS (ABSOLUTE): 0.3 10*3/uL (ref 0.0–0.4)
Eos: 6 %
Hematocrit: 42 % (ref 37.5–51.0)
Hemoglobin: 14.2 g/dL (ref 13.0–17.7)
Immature Grans (Abs): 0 10*3/uL (ref 0.0–0.1)
Immature Granulocytes: 0 %
Lymphocytes Absolute: 2.2 10*3/uL (ref 0.7–3.1)
Lymphs: 37 %
MCH: 30.9 pg (ref 26.6–33.0)
MCHC: 33.8 g/dL (ref 31.5–35.7)
MCV: 91 fL (ref 79–97)
MONOS ABS: 0.5 10*3/uL (ref 0.1–0.9)
Monocytes: 8 %
NEUTROS PCT: 48 %
Neutrophils Absolute: 2.8 10*3/uL (ref 1.4–7.0)
Platelets: 332 10*3/uL (ref 150–450)
RBC: 4.6 x10E6/uL (ref 4.14–5.80)
RDW: 13.5 % (ref 12.3–15.4)
WBC: 5.8 10*3/uL (ref 3.4–10.8)

## 2018-02-14 LAB — TSH: TSH: 3.45 u[IU]/mL (ref 0.450–4.500)

## 2018-02-14 LAB — VITAMIN D 25 HYDROXY (VIT D DEFICIENCY, FRACTURES): Vit D, 25-Hydroxy: 35.9 ng/mL (ref 30.0–100.0)

## 2018-02-14 LAB — T4, FREE: Free T4: 1.17 ng/dL (ref 0.82–1.77)

## 2018-02-26 ENCOUNTER — Other Ambulatory Visit: Payer: Self-pay | Admitting: Family Medicine

## 2018-02-26 DIAGNOSIS — E781 Pure hyperglyceridemia: Secondary | ICD-10-CM

## 2018-03-23 ENCOUNTER — Encounter (INDEPENDENT_AMBULATORY_CARE_PROVIDER_SITE_OTHER): Payer: Self-pay

## 2018-03-30 ENCOUNTER — Encounter (INDEPENDENT_AMBULATORY_CARE_PROVIDER_SITE_OTHER): Payer: Self-pay | Admitting: Family Medicine

## 2018-03-30 ENCOUNTER — Ambulatory Visit (INDEPENDENT_AMBULATORY_CARE_PROVIDER_SITE_OTHER): Payer: BLUE CROSS/BLUE SHIELD | Admitting: Family Medicine

## 2018-03-30 VITALS — BP 126/84 | HR 80 | Temp 98.1°F | Ht 70.0 in | Wt 315.0 lb

## 2018-03-30 DIAGNOSIS — E559 Vitamin D deficiency, unspecified: Secondary | ICD-10-CM | POA: Diagnosis not present

## 2018-03-30 DIAGNOSIS — R0602 Shortness of breath: Secondary | ICD-10-CM | POA: Diagnosis not present

## 2018-03-30 DIAGNOSIS — Z1331 Encounter for screening for depression: Secondary | ICD-10-CM

## 2018-03-30 DIAGNOSIS — I10 Essential (primary) hypertension: Secondary | ICD-10-CM

## 2018-03-30 DIAGNOSIS — R5383 Other fatigue: Secondary | ICD-10-CM

## 2018-03-30 DIAGNOSIS — Z9189 Other specified personal risk factors, not elsewhere classified: Secondary | ICD-10-CM

## 2018-03-30 DIAGNOSIS — Z6841 Body Mass Index (BMI) 40.0 and over, adult: Secondary | ICD-10-CM

## 2018-03-30 DIAGNOSIS — E7849 Other hyperlipidemia: Secondary | ICD-10-CM | POA: Diagnosis not present

## 2018-03-30 DIAGNOSIS — Z0289 Encounter for other administrative examinations: Secondary | ICD-10-CM

## 2018-03-30 NOTE — Progress Notes (Signed)
Office: [phone]  /  Fax: [phone]   Dear Dr. Sharee Martin,   Thank you for referring Corey Martin to our clinic. The following note includes my evaluation and treatment recommendations.  HPI:   Chief Complaint: OBESITY    Corey Martin has been referred by Corey Lot, DO for consultation regarding his obesity and obesity related comorbidities.    Corey Martin (MR# [id number]) is a 42 Corey Martin.o. male who presents on 03/30/2018 for obesity evaluation and treatment. Current BMI is Body mass index is 45.2 kg/m.Corey Martin has been struggling with his weight for many years and has been unsuccessful in either losing weight, maintaining weight loss, or reaching his healthy weight goal.     Corey Martin attended our information session and states he is currently in the action stage of change and ready to dedicate time achieving and maintaining a healthier weight. Corey Martin is interested in becoming our patient and working on intensive lifestyle modifications including (but not limited to) diet, exercise and weight loss.    Corey Martin states his family eats meals together he thinks his family will eat healthier with  him his desired weight loss is 75 lbs he has been heavy most of  his life he started gaining weight in college (1997 to 20001) his heaviest weight ever was 366 lbs. he has significant food cravings issues  he snacks frequently in the evenings he skips meals frequently he is frequently drinking liquids with calories he frequently makes poor food choices he frequently eats larger portions than normal  he has binge eating behaviors he struggles with emotional eating    Fatigue Corey Martin feels his energy is lower than it should be. This has worsened with weight gain and has not worsened recently. Corey Martin admits to daytime somnolence and  admits to waking up still tired. Patient is at risk for obstructive sleep apnea. Patent has a history of symptoms of daytime fatigue, morning fatigue and hypertension. Patient generally  gets 6 hours of sleep per night, and states they generally have restful sleep. Snoring is present. Apneic episodes are present. Epworth Sleepiness Score is 5  Dyspnea on exertion Corey Martin notes increasing shortness of breath with exercising and seems to be worsening over time with weight gain. He notes getting out of breath sooner with activity than he used to. This has not gotten worse recently. Corey Martin denies orthopnea.  Vitamin D deficiency Corey Martin has a diagnosis of vitamin D deficiency. He is currently taking OTC vit D and denies nausea, vomiting or muscle weakness.  Hypertension Corey Martin is a 42 Corey Martin.o. male with hypertension. His blood pressure is stable on medications. Corey Martin denies chest pain or headache. He is working weight loss to help control his blood pressure with the goal of decreasing his risk of heart attack and stroke. Corey Martin blood pressure is currently controlled.  Hyperlipidemia Corey Martin has hyperlipidemia and he is currently on fenofibrate. He is attempting to improve his cholesterol levels with intensive lifestyle modification including a low saturated fat diet, exercise and weight loss. He denies any chest pain or myalgias.  At risk for cardiovascular disease Corey Martin is at a higher than average risk for cardiovascular disease due to obesity, hypertension and hyperlipidemia. He currently denies any chest pain.  Depression Screen Corey Martin's Food and Mood (modified PHQ-9) score was  Depression screen PHQ 2/9 03/30/2018  Decreased Interest 1  Down, Depressed, Hopeless 0  PHQ - 2 Score 1  Altered sleeping 2  Tired, decreased energy 2  Change in appetite 1  Feeling  bad or failure about yourself  0  Trouble concentrating 1  Moving slowly or fidgety/restless 0  Suicidal thoughts 0  PHQ-9 Score 7  Difficult doing work/chores Not difficult at all    ALLERGIES: No Known Allergies  MEDICATIONS: Current Outpatient Medications on File Prior to Visit  Medication Sig Dispense Refill  .  amLODipine (NORVASC) 5 MG tablet TAKE 2 TABLETS BY MOUTH EVERY DAY 180 tablet 0  . buPROPion (WELLBUTRIN XL) 300 MG 24 hr tablet TAKE 1 TABLET BY MOUTH EVERY DAY 30 tablet 0  . Choline Fenofibrate (FENOFIBRIC ACID) 135 MG CPDR TAKE 1 CAPSULE BY MOUTH EVERY DAY 90 capsule 1  . LamoTRIgine (LAMICTAL PO) Take 100 mg by mouth daily.     Marland Kitchen lisinopril-hydrochlorothiazide (PRINZIDE,ZESTORETIC) 20-12.5 MG tablet Take 1 tablet by mouth daily. 90 tablet 1  . LORazepam (ATIVAN) 0.5 MG tablet Take 0.5 mg by mouth every 8 (eight) hours as needed for anxiety.    . Vitamin D, Ergocalciferol, (DRISDOL) 50000 units CAPS capsule Take 1 capsule (50,000 Units total) by mouth every 7 (seven) days. 12 capsule 3   No current facility-administered medications on file prior to visit.     PAST MEDICAL HISTORY: Past Medical History:  Diagnosis Date  . Bipolar disorder (HCC)   . Constipation   . Depression    bipolar depression  . Hypercholesteremia   . Hypertension   . Joint pain   . Knee pain   . Sleep apnea     PAST SURGICAL HISTORY: Past Surgical History:  Procedure Laterality Date  . ADENOIDECTOMY    . KNEE SURGERY      SOCIAL HISTORY: Social History   Tobacco Use  . Smoking status: Never Smoker  . Smokeless tobacco: Never Used  Substance Use Topics  . Alcohol use: Yes    Alcohol/week: 3.0 standard drinks    Types: 1 Glasses of wine, 1 Cans of beer, 1 Shots of liquor per week  . Drug use: No    FAMILY HISTORY: Family History  Problem Relation Age of Onset  . Hyperlipidemia Mother   . Hypertension Mother   . Depression Mother   . Anxiety disorder Mother   . Obesity Mother   . Heart attack Father   . Hyperlipidemia Father   . Hypertension Father   . Obesity Father   . Sleep apnea Father   . Diabetes Maternal Grandmother     ROS: Review of Systems  Constitutional: Positive for malaise/fatigue.  Eyes:       Wear Glasses or Contacts  Respiratory: Positive for shortness of breath  (on exertion).   Cardiovascular: Negative for chest pain and orthopnea.  Gastrointestinal: Negative for nausea and vomiting.  Musculoskeletal:       Muscle or Joint Pain Negative for muscle weakness  Neurological: Negative for headaches.  Psychiatric/Behavioral: Positive for depression.    PHYSICAL EXAM: Blood pressure 126/84, pulse 80, temperature 98.1 F (36.7 C), temperature source Oral, height 5\' 10"  (1.778 m), weight (!) 315 lb (142.9 kg), SpO2 94 %. Body mass index is 45.2 kg/m. Physical Exam  Constitutional: He is oriented to person, place, and time. He appears well-developed and well-nourished.  HENT:  Head: Normocephalic and atraumatic.  Nose: Nose normal.  Eyes: EOM are normal. No scleral icterus.  Neck: Normal range of motion. Neck supple. No thyromegaly present.  Cardiovascular: Normal rate and regular rhythm.  Pulmonary/Chest: Effort normal. No respiratory distress.  Abdominal: Soft. There is no tenderness.  + obesity  Musculoskeletal: Normal  range of motion.  Range of Motion normal in all 4 extremities  Neurological: He is alert and oriented to person, place, and time. Coordination normal.  Skin: Skin is warm and dry.  Psychiatric: He has a normal mood and affect. His behavior is normal.  Vitals reviewed.   RECENT LABS AND TESTS: BMET    Component Value Date/Time   NA 143 02/13/2018 0849   K 4.0 02/13/2018 0849   CL 104 02/13/2018 0849   CO2 23 02/13/2018 0849   GLUCOSE 84 02/13/2018 0849   BUN 13 02/13/2018 0849   CREATININE 0.90 02/13/2018 0849   CALCIUM 9.1 02/13/2018 0849   GFRNONAA 107 02/13/2018 0849   GFRAA 124 02/13/2018 0849   Lab Results  Component Value Date   HGBA1C 5.3 02/13/2018   No results found for: INSULIN CBC    Component Value Date/Time   WBC 5.8 02/13/2018 0849   RBC 4.60 02/13/2018 0849   HGB 14.2 02/13/2018 0849   HCT 42.0 02/13/2018 0849   PLT 332 02/13/2018 0849   MCV 91 02/13/2018 0849   MCH 30.9 02/13/2018 0849    MCHC 33.8 02/13/2018 0849   RDW 13.5 02/13/2018 0849   LYMPHSABS 2.2 02/13/2018 0849   EOSABS 0.3 02/13/2018 0849   BASOSABS 0.0 02/13/2018 0849   Iron/TIBC/Ferritin/ %Sat No results found for: IRON, TIBC, FERRITIN, IRONPCTSAT Lipid Panel     Component Value Date/Time   CHOL 209 (H) 02/13/2018 0849   TRIG 127 02/13/2018 0849   HDL 53 02/13/2018 0849   CHOLHDL 3.9 02/13/2018 0849   LDLCALC 131 (H) 02/13/2018 0849   Hepatic Function Panel     Component Value Date/Time   PROT 6.8 02/13/2018 0849   ALBUMIN 4.7 02/13/2018 0849   AST 21 02/13/2018 0849   ALT 36 02/13/2018 0849   ALKPHOS 36 (L) 02/13/2018 0849   BILITOT 0.3 02/13/2018 0849      Component Value Date/Time   TSH 3.450 02/13/2018 0849   TSH 3.230 01/11/2017 0920   TSH 2.280 08/02/2016 0856    Vitamin D Results for Betancur, Jaryd "PREFERS TO GO BY [name]" (MRN [id number]) as of 03/30/2018 11:59  Ref. Range 02/13/2018 08:49  Vitamin D, 25-Hydroxy Latest Ref Range: 30.0 - 100.0 ng/mL 35.9    ECG  shows NSR with a rate of 82 BPM INDIRECT CALORIMETER done today shows a VO2 of 377 and a REE of 2622.  His calculated basal metabolic rate is 4098 thus his basal metabolic rate is worse than expected.    ASSESSMENT AND PLAN: Other fatigue - Plan: EKG 12-Lead, Vitamin B12, CBC With Differential, Folate, Hemoglobin A1c, Insulin, random, T3, T4, free, TSH  Shortness of breath on exertion - Plan: CBC With Differential  Vitamin D deficiency - Plan: VITAMIN D 25 Hydroxy (Vit-D Deficiency, Fractures)  Essential hypertension - Plan: Comprehensive metabolic panel  Other hyperlipidemia - Plan: Lipid Panel With LDL/HDL Ratio  Depression screening  At risk for heart disease  Class 3 severe obesity with serious comorbidity and body mass index (BMI) of 45.0 to 49.9 in adult, unspecified obesity type (HCC)  PLAN: Fatigue Traci was informed that his fatigue may be related to obesity, depression or many other causes. Labs will be  ordered, and in the meanwhile Timithy has agreed to work on diet, exercise and weight loss to help with fatigue. Proper sleep hygiene was discussed including the need for 7-8 hours of quality sleep each night. A sleep study was not ordered based on symptoms and  Epworth score.  Dyspnea on exertion Kaimana's shortness of breath appears to be obesity related and exercise induced. He has agreed to work on weight loss and gradually increase exercise to treat his exercise induced shortness of breath. If Ocean follows our instructions and loses weight without improvement of his shortness of breath, we will plan to refer to pulmonology. We will monitor this condition regularly. Marlos agrees to this plan.  Vitamin D Deficiency Corey Martin was informed that low vitamin D levels contributes to fatigue and are associated with obesity, breast, and colon cancer. He agrees to continue to take OTC vitamin D. We will check labs and will follow up for routine testing of vitamin D, at least 2-3 times per year. He was informed of the risk of over-replacement of vitamin D and agrees to not increase his dose unless he discusses this with [place] first.  Hypertension We discussed sodium restriction, working on healthy weight loss, and a regular exercise program as the means to achieve improved blood pressure control. Corey Martin agreed with this plan and agreed to follow up as directed. We will check labs and follow. We will continue to monitor his blood pressure as well as his progress with the above lifestyle modifications. He will continue his medications as prescribed and will watch for signs of hypotension as he continues his lifestyle modifications.  Hyperlipidemia Corey Martin was informed of the American Heart Association Guidelines emphasizing intensive lifestyle modifications as the first line treatment for hyperlipidemia. We discussed many lifestyle modifications today in depth, and Kaeden will continue to work on decreasing saturated fats such as fatty red  meat, butter and many fried foods. He will also increase vegetables and lean protein in his diet and start to work on exercise and weight loss efforts. We will check labs and Corey Martin will follow up as directed.  Cardiovascular risk counseling Trevino was given extended (15 minutes) coronary artery disease prevention counseling today. He is 42 Corey Martin.o. male and has risk factors for heart disease including obesity, hypertension and hyperlipidemia. We discussed intensive lifestyle modifications today with an emphasis on specific weight loss instructions and strategies. Pt was also informed of the importance of increasing exercise and decreasing saturated fats to help prevent heart disease.  Depression Screen Corey Martin had a mildly positive depression screening. Depression is commonly associated with obesity and often results in emotional eating behaviors. We will monitor this closely and work on CBT to help improve the non-hunger eating patterns. Referral to Psychology may be required if no improvement is seen as he continues in our clinic.  Obesity Corey Martin is currently in the action stage of change and his goal is to continue with weight loss efforts. I recommend Corey Martin begin the structured treatment plan as follows:  He has agreed to follow the Category 4 plan Corey Martin has been instructed to eventually work up to a goal of 150 minutes of combined cardio and strengthening exercise per week for weight loss and overall health benefits. We discussed the following Behavioral Modification Strategies today: increasing lean protein intake, decreasing simple carbohydrates , decrease eating out and work on meal planning and easy cooking plans   He was informed of the importance of frequent follow up visits to maximize his success with intensive lifestyle modifications for his multiple health conditions. He was informed we would discuss his lab results at his next visit unless there is a critical issue that needs to be addressed sooner. Sahid  agreed to keep his next visit at the agreed upon time to  discuss these results.    OBESITY BEHAVIORAL INTERVENTION VISIT  Today's visit was # 1   Starting weight: 315 lbs Starting date: 03/30/18 Today's weight : 315 lbs Today's date: 03/30/2018 Total lbs lost to date: 0   ASK: We discussed the diagnosis of obesity with Aloha Gell today and Latravious agreed to give [place] permission to discuss obesity behavioral modification therapy today.  ASSESS: Atem has the diagnosis of obesity and his BMI today is 36.2 Kinard is in the action stage of change   ADVISE: Laroderick was educated on the multiple health risks of obesity as well as the benefit of weight loss to improve his health. He was advised of the need for long term treatment and the importance of lifestyle modifications to improve his current health and to decrease his risk of future health problems.  AGREE: Multiple dietary modification options and treatment options were discussed and  Giezi agreed to follow the recommendations documented in the above note.  ARRANGE: Asahd was educated on the importance of frequent visits to treat obesity as outlined per CMS and USPSTF guidelines and agreed to schedule his next follow up appointment today.  I, [place] Crane, am acting as transcriptionist for Quillian Quince, MD   I have reviewed the above documentation for accuracy and completeness, and I agree with the above. -Quillian Quince, MD

## 2018-03-31 LAB — CBC WITH DIFFERENTIAL
BASOS: 0 %
Basophils Absolute: 0 10*3/uL (ref 0.0–0.2)
EOS (ABSOLUTE): 0.3 10*3/uL (ref 0.0–0.4)
EOS: 5 %
HEMATOCRIT: 44 % (ref 37.5–51.0)
HEMOGLOBIN: 14.7 g/dL (ref 13.0–17.7)
IMMATURE GRANS (ABS): 0 10*3/uL (ref 0.0–0.1)
Immature Granulocytes: 0 %
LYMPHS: 31 %
Lymphocytes Absolute: 2 10*3/uL (ref 0.7–3.1)
MCH: 30.1 pg (ref 26.6–33.0)
MCHC: 33.4 g/dL (ref 31.5–35.7)
MCV: 90 fL (ref 79–97)
MONOCYTES: 11 %
Monocytes Absolute: 0.8 10*3/uL (ref 0.1–0.9)
Neutrophils Absolute: 3.5 10*3/uL (ref 1.4–7.0)
Neutrophils: 53 %
RBC: 4.88 x10E6/uL (ref 4.14–5.80)
RDW: 13.1 % (ref 12.3–15.4)
WBC: 6.7 10*3/uL (ref 3.4–10.8)

## 2018-03-31 LAB — COMPREHENSIVE METABOLIC PANEL
ALBUMIN: 4.9 g/dL (ref 3.5–5.5)
ALK PHOS: 35 IU/L — AB (ref 39–117)
ALT: 36 IU/L (ref 0–44)
AST: 23 IU/L (ref 0–40)
Albumin/Globulin Ratio: 2 (ref 1.2–2.2)
BUN / CREAT RATIO: 12 (ref 9–20)
BUN: 12 mg/dL (ref 6–24)
Bilirubin Total: 0.4 mg/dL (ref 0.0–1.2)
CHLORIDE: 102 mmol/L (ref 96–106)
CO2: 21 mmol/L (ref 20–29)
Calcium: 10 mg/dL (ref 8.7–10.2)
Creatinine, Ser: 0.98 mg/dL (ref 0.76–1.27)
GFR calc non Af Amer: 96 mL/min/{1.73_m2} (ref >59)
GFR, EST AFRICAN AMERICAN: 111 mL/min/{1.73_m2} (ref >59)
GLOBULIN, TOTAL: 2.4 g/dL (ref 1.5–4.5)
Glucose: 89 mg/dL (ref 65–99)
Potassium: 4.3 mmol/L (ref 3.5–5.2)
Sodium: 139 mmol/L (ref 134–144)
TOTAL PROTEIN: 7.3 g/dL (ref 6.0–8.5)

## 2018-03-31 LAB — TSH: TSH: 2.7 u[IU]/mL (ref 0.450–4.500)

## 2018-03-31 LAB — HEMOGLOBIN A1C
Est. average glucose Bld gHb Est-mCnc: 105 mg/dL
Hgb A1c MFr Bld: 5.3 % (ref 4.8–5.6)

## 2018-03-31 LAB — LIPID PANEL WITH LDL/HDL RATIO
CHOLESTEROL TOTAL: 204 mg/dL — AB (ref 100–199)
HDL: 48 mg/dL (ref >39)
LDL Calculated: 125 mg/dL — ABNORMAL HIGH (ref 0–99)
LDl/HDL Ratio: 2.6 ratio (ref 0.0–3.6)
Triglycerides: 153 mg/dL — ABNORMAL HIGH (ref 0–149)
VLDL CHOLESTEROL CAL: 31 mg/dL (ref 5–40)

## 2018-03-31 LAB — INSULIN, RANDOM: INSULIN: 18.1 u[IU]/mL (ref 2.6–24.9)

## 2018-03-31 LAB — FOLATE: FOLATE: 8.2 ng/mL (ref >3.0)

## 2018-03-31 LAB — T3: T3, Total: 133 ng/dL (ref 71–180)

## 2018-03-31 LAB — VITAMIN D 25 HYDROXY (VIT D DEFICIENCY, FRACTURES): VIT D 25 HYDROXY: 37.8 ng/mL (ref 30.0–100.0)

## 2018-03-31 LAB — VITAMIN B12: Vitamin B-12: 197 pg/mL — ABNORMAL LOW (ref 232–1245)

## 2018-03-31 LAB — T4, FREE: Free T4: 1.48 ng/dL (ref 0.82–1.77)

## 2018-04-04 ENCOUNTER — Other Ambulatory Visit: Payer: Self-pay | Admitting: Family Medicine

## 2018-04-04 DIAGNOSIS — I159 Secondary hypertension, unspecified: Secondary | ICD-10-CM

## 2018-04-12 ENCOUNTER — Ambulatory Visit (INDEPENDENT_AMBULATORY_CARE_PROVIDER_SITE_OTHER): Payer: BLUE CROSS/BLUE SHIELD | Admitting: Family Medicine

## 2018-04-12 VITALS — BP 116/75 | HR 97 | Temp 98.0°F | Ht 70.0 in | Wt 307.0 lb

## 2018-04-12 DIAGNOSIS — F319 Bipolar disorder, unspecified: Secondary | ICD-10-CM

## 2018-04-12 DIAGNOSIS — E559 Vitamin D deficiency, unspecified: Secondary | ICD-10-CM

## 2018-04-12 DIAGNOSIS — Z9189 Other specified personal risk factors, not elsewhere classified: Secondary | ICD-10-CM | POA: Diagnosis not present

## 2018-04-12 DIAGNOSIS — E8881 Metabolic syndrome: Secondary | ICD-10-CM | POA: Diagnosis not present

## 2018-04-12 DIAGNOSIS — Z6841 Body Mass Index (BMI) 40.0 and over, adult: Secondary | ICD-10-CM

## 2018-04-12 MED ORDER — TOPIRAMATE 50 MG PO TABS: 50.0000 mg | ORAL_TABLET | Freq: Every day | ORAL | 0 refills | Status: DC

## 2018-04-12 MED ORDER — VITAMIN D (ERGOCALCIFEROL) 1.25 MG (50000 UNIT) PO CAPS: 50000.0000 [IU] | ORAL_CAPSULE | ORAL | 0 refills | Status: DC

## 2018-04-17 NOTE — Progress Notes (Signed)
Office: [phone]  /  Fax: [phone]   HPI:   Chief Complaint: OBESITY Corey Martin is here to discuss his progress with his obesity treatment plan. He is on the Category 4 plan and is following his eating plan approximately 87.2 % of the time. He states he is walking 30 minutes 1 to 2 times per week. Corey Martin did well with weight loss. He struggled with eating out due to extra social occasions. He states that hunger is controlled, but he still notes problems with cravings. His weight is (!) 307 lb (139.3 kg) today and has had a weight loss of 8 pounds over a period of 2 weeks since his last visit. He has lost 8 lbs since starting treatment with [place].  Vitamin D deficiency Corey Martin has a diagnosis of vitamin D deficiency. He is currently taking weekly prescription vit D , but his level is not at goal. Corey Martin denies nausea, vomiting or muscle weakness.  Bipolar Depression Corey Martin has a diagnosis of bipolar depression and is on lamictal and wellbutrin currently, but he felt deprived while trying to eat healthy. He has no history of nephrolithiasis.  Insulin Resistance (new) Corey Martin has a new diagnosis of insulin resistance. Corey Martin has normal Hgb A1c and glucose, but he has elevated fasting insulin level >5. Although Corey Martin's blood glucose readings are still under good control, insulin resistance puts him at greater risk of metabolic syndrome and diabetes. He is not taking metformin currently and continues to work on diet and exercise to decrease risk of diabetes. Polyphagia improved on his diet prescription.  At risk for diabetes Corey Martin is at higher than average risk for developing diabetes due to his obesity and insulin resistance. He currently denies polyuria or polydipsia.  ALLERGIES: No Known Allergies  MEDICATIONS: Current Outpatient Medications on File Prior to Visit  Medication Sig Dispense Refill  . amLODipine (NORVASC) 5 MG tablet TAKE 2 TABLETS BY MOUTH EVERY DAY 180 tablet 0  . buPROPion (WELLBUTRIN XL) 300  MG 24 hr tablet TAKE 1 TABLET BY MOUTH EVERY DAY 30 tablet 0  . Choline Fenofibrate (FENOFIBRIC ACID) 135 MG CPDR TAKE 1 CAPSULE BY MOUTH EVERY DAY 90 capsule 1  . LamoTRIgine (LAMICTAL PO) Take 100 mg by mouth daily.     Marland Kitchen lisinopril-hydrochlorothiazide (PRINZIDE,ZESTORETIC) 20-12.5 MG tablet TAKE 1 TABLET BY MOUTH EVERY DAY 90 tablet 1  . LORazepam (ATIVAN) 0.5 MG tablet Take 0.5 mg by mouth every 8 (eight) hours as needed for anxiety.     No current facility-administered medications on file prior to visit.     PAST MEDICAL HISTORY: Past Medical History:  Diagnosis Date  . Bipolar disorder (HCC)   . Constipation   . Depression    bipolar depression  . Hypercholesteremia   . Hypertension   . Joint pain   . Knee pain   . Sleep apnea     PAST SURGICAL HISTORY: Past Surgical History:  Procedure Laterality Date  . ADENOIDECTOMY    . KNEE SURGERY      SOCIAL HISTORY: Social History   Tobacco Use  . Smoking status: Never Smoker  . Smokeless tobacco: Never Used  Substance Use Topics  . Alcohol use: Yes    Alcohol/week: 3.0 standard drinks    Types: 1 Glasses of wine, 1 Cans of beer, 1 Shots of liquor per week  . Drug use: No    FAMILY HISTORY: Family History  Problem Relation Age of Onset  . Hyperlipidemia Mother   . Hypertension Mother   .  Depression Mother   . Anxiety disorder Mother   . Obesity Mother   . Heart attack Father   . Hyperlipidemia Father   . Hypertension Father   . Obesity Father   . Sleep apnea Father   . Diabetes Maternal Grandmother     ROS: Review of Systems  Constitutional: Positive for weight loss.  Gastrointestinal: Negative for nausea and vomiting.  Genitourinary: Negative for frequency.  Musculoskeletal:       Negative for muscle weakness  Endo/Heme/Allergies: Negative for polydipsia.       Positive for cravings  Psychiatric/Behavioral: Positive for depression.    PHYSICAL EXAM: Blood pressure 116/75, pulse 97, temperature 98  F (36.7 C), temperature source Oral, height 5\' 10"  (1.778 m), weight (!) 307 lb (139.3 kg), SpO2 95 %. Body mass index is 44.05 kg/m. Physical Exam  Constitutional: He is oriented to person, place, and time. He appears well-developed and well-nourished.  Cardiovascular: Normal rate.  Pulmonary/Chest: Effort normal.  Musculoskeletal: Normal range of motion.  Neurological: He is oriented to person, place, and time.  Skin: Skin is warm and dry.  Psychiatric: He has a normal mood and affect. His behavior is normal.  Vitals reviewed.   RECENT LABS AND TESTS: BMET    Component Value Date/Time   NA 139 03/30/2018 0913   K 4.3 03/30/2018 0913   CL 102 03/30/2018 0913   CO2 21 03/30/2018 0913   GLUCOSE 89 03/30/2018 0913   BUN 12 03/30/2018 0913   CREATININE 0.98 03/30/2018 0913   CALCIUM 10.0 03/30/2018 0913   GFRNONAA 96 03/30/2018 0913   GFRAA 111 03/30/2018 0913   Lab Results  Component Value Date   HGBA1C 5.3 03/30/2018   HGBA1C 5.3 02/13/2018   HGBA1C 5.2 08/02/2016   Lab Results  Component Value Date   INSULIN 18.1 03/30/2018   CBC    Component Value Date/Time   WBC 6.7 03/30/2018 0913   RBC 4.88 03/30/2018 0913   HGB 14.7 03/30/2018 0913   HCT 44.0 03/30/2018 0913   PLT 332 02/13/2018 0849   MCV 90 03/30/2018 0913   MCH 30.1 03/30/2018 0913   MCHC 33.4 03/30/2018 0913   RDW 13.1 03/30/2018 0913   LYMPHSABS 2.0 03/30/2018 0913   EOSABS 0.3 03/30/2018 0913   BASOSABS 0.0 03/30/2018 0913   Iron/TIBC/Ferritin/ %Sat No results found for: IRON, TIBC, FERRITIN, IRONPCTSAT Lipid Panel     Component Value Date/Time   CHOL 204 (H) 03/30/2018 0913   TRIG 153 (H) 03/30/2018 0913   HDL 48 03/30/2018 0913   CHOLHDL 3.9 02/13/2018 0849   LDLCALC 125 (H) 03/30/2018 0913   Hepatic Function Panel     Component Value Date/Time   PROT 7.3 03/30/2018 0913   ALBUMIN 4.9 03/30/2018 0913   AST 23 03/30/2018 0913   ALT 36 03/30/2018 0913   ALKPHOS 35 (L) 03/30/2018  0913   BILITOT 0.4 03/30/2018 0913      Component Value Date/Time   TSH 2.700 03/30/2018 0913   TSH 3.450 02/13/2018 0849   TSH 3.230 01/11/2017 0920   Results for Corey Martin" (MRN [id number]) as of 04/17/2018 07:47  Ref. Range 03/30/2018 09:13  Vitamin D, 25-Hydroxy Latest Ref Range: 30.0 - 100.0 ng/mL 37.8   ASSESSMENT AND PLAN: Vitamin D deficiency - Plan: Vitamin D, Ergocalciferol, (DRISDOL) 50000 units CAPS capsule  Bipolar depression (HCC) - Plan: topiramate (TOPAMAX) 50 MG tablet  Insulin resistance  At risk for diabetes mellitus  Class 3 severe obesity  with serious comorbidity and body mass index (BMI) of 40.0 to 44.9 in adult, unspecified obesity type (HCC)  PLAN:  Vitamin D Deficiency Corey Martin was informed that low vitamin D levels contributes to fatigue and are associated with obesity, breast, and colon cancer. He agrees to increase prescription Vit D @50 ,000 IU to every 3 days #10 with no refills and will follow up for routine testing of vitamin D, at least 2-3 times per year. He was informed of the risk of over-replacement of vitamin D and agrees to not increase his dose unless he discusses this with us first. Corey Martin agrees to follow up as directed.  Bipolar Depression Corey Martin agrees to start topiramate 50 mg qHS #30 with no refills and follow up with our clinic in 2 to 3 weeks.  Insulin Resistance Corey Martin will continue to work on weight loss, exercise, and decreasing simple carbohydrates in his diet to help decrease the risk of diabetes. We dicussed metformin including benefits and risks. He was informed that eating too many simple carbohydrates or too many calories at one sitting increases the likelihood of GI side effects. Corey Martin deferred metformin for now and prescription was not written today. We will recheck labs in 3 months and Corey Martin agreed to follow up with us as directed to monitor his progress.  Diabetes risk counseling Corey Martin was given extended (30 minutes) diabetes  prevention counseling today. He is 42 y.o. male and has risk factors for diabetes including obesity and insulin resistance. We discussed intensive lifestyle modifications today with an emphasis on weight loss as well as increasing exercise and decreasing simple carbohydrates in his diet.   Obesity Corey Martin is currently in the action stage of change. As such, his goal is to continue with weight loss efforts He has agreed to follow the Category 1 plan Corey Martin has been instructed to work up to a goal of 150 minutes of combined cardio and strengthening exercise per week for weight loss and overall health benefits. We discussed the following Behavioral Modification Strategies today: increasing lean protein intake and decrease eating out  Corey Martin has agreed to follow up with our clinic in 2 to 3 weeks. He was informed of the importance of frequent follow up visits to maximize his success with intensive lifestyle modifications for his multiple health conditions.   OBESITY BEHAVIORAL INTERVENTION VISIT  Today's visit was # 2   Starting weight: 315 lbs Starting date: 03/30/18 Today's weight : 307 lbs Today's date: 04/12/2018 Total lbs lost to date: 8   ASK: We discussed the diagnosis of obesity with Corey Martin today and Corey Martin agreed to give us permission to discuss obesity behavioral modification therapy today.  ASSESS: Corey Martin has the diagnosis of obesity and his BMI today is 3244.05 Corey Martin is in the action stage of change   ADVISE: Corey Martin was educated on the multiple health risks of obesity as well as the benefit of weight loss to improve his health. He was advised of the need for long term treatment and the importance of lifestyle modifications to improve his current health and to decrease his risk of future health problems.  AGREE: Multiple dietary modification options and treatment options were discussed and  Corey Martin agreed to follow the recommendations documented in the above note.  ARRANGE: Corey Martin was educated  on the importance of frequent visits to treat obesity as outlined per CMS and USPSTF guidelines and agreed to schedule his next follow up appointment today.  [place] Crane, Joanne Murray, am acting as Energy managertranscriptionist for Longs Drug StoresCaren Beasley,  MD  I have reviewed the above documentation for accuracy and completeness, and I agree with the above. -Dennard Nip, MD

## 2018-04-18 ENCOUNTER — Ambulatory Visit (INDEPENDENT_AMBULATORY_CARE_PROVIDER_SITE_OTHER): Payer: BLUE CROSS/BLUE SHIELD | Admitting: Family Medicine

## 2018-04-27 ENCOUNTER — Other Ambulatory Visit: Payer: Self-pay | Admitting: Family Medicine

## 2018-04-27 DIAGNOSIS — I159 Secondary hypertension, unspecified: Secondary | ICD-10-CM

## 2018-05-02 ENCOUNTER — Ambulatory Visit (INDEPENDENT_AMBULATORY_CARE_PROVIDER_SITE_OTHER): Payer: BLUE CROSS/BLUE SHIELD | Admitting: Family Medicine

## 2018-05-02 VITALS — BP 133/81 | HR 87 | Ht 70.0 in | Wt 297.0 lb

## 2018-05-02 DIAGNOSIS — Z6841 Body Mass Index (BMI) 40.0 and over, adult: Secondary | ICD-10-CM | POA: Diagnosis not present

## 2018-05-02 DIAGNOSIS — F3289 Other specified depressive episodes: Secondary | ICD-10-CM | POA: Diagnosis not present

## 2018-05-02 DIAGNOSIS — E559 Vitamin D deficiency, unspecified: Secondary | ICD-10-CM

## 2018-05-02 MED ORDER — VITAMIN D (ERGOCALCIFEROL) 1.25 MG (50000 UNIT) PO CAPS: 50000.0000 [IU] | ORAL_CAPSULE | ORAL | 0 refills | Status: DC

## 2018-05-03 NOTE — Progress Notes (Signed)
Office: [phone]  /  Fax: [phone]   HPI:   Chief Complaint: OBESITY Corey Martin is here to discuss his progress with his obesity treatment plan. He is on the Category 4 plan and is following his eating plan approximately 91.3 % of the time. He states he is walking for 30 minutes 3 times per week. Corey Martin notes hunger is controlled on his Category 4 plan. He notes cravings have improved. He is still eating out but trying to be mindful. He is having insurance problems with [employer] covering his counseling services. His weight is 297 lb (134.7 kg) today and has had a weight loss of 10 pounds over a period of 3 weeks since his last visit. He has lost 18 lbs since starting treatment with [place].  Vitamin D Deficiency Corey Martin has a diagnosis of vitamin D deficiency. He is currently taking prescription Vit D and denies nausea, vomiting or muscle weakness.  Depression with emotional eating behaviors Corey Martin was started on topiramate due to increased emotional eating, but he felt he was doing better on his own so he didn't start the medication. Corey Martin struggles with emotional eating and using food for comfort to the extent that it is negatively impacting his health. He often snacks when he is not hungry. Corey Martin sometimes feels he is out of control and then feels guilty that he made poor food choices. He has been working on behavior modification techniques to help reduce his emotional eating and has been somewhat successful. He shows no sign of suicidal or homicidal ideations.  Depression screen Corey Martin 2/9 03/30/2018 02/13/2018 12/29/2017 08/30/2017 05/17/2017  Decreased Interest 1 0 0 0 0  Down, Depressed, Hopeless 0 0 0 1 1  PHQ - 2 Score 1 0 0 1 1  Altered sleeping 2 0 0 1 0  Tired, decreased energy 2 0 0 1 1  Change in appetite 1 0 0 1 1  Feeling bad or failure about yourself  0 0 0 0 0  Trouble concentrating 1 0 0 0 0  Moving slowly or fidgety/restless 0 0 0 0 0  Suicidal thoughts 0 0 0 0 0  PHQ-9 Score 7 0  0 4 3  Difficult doing work/chores Not difficult at all Not difficult at all Not difficult at all Not difficult at all Not difficult at all    ALLERGIES: No Known Allergies  MEDICATIONS: Current Outpatient Medications on File Prior to Visit  Medication Sig Dispense Refill  . amLODipine (NORVASC) 5 MG tablet TAKE 2 TABLETS BY MOUTH EVERY DAY 180 tablet 0  . buPROPion (WELLBUTRIN XL) 300 MG 24 hr tablet TAKE 1 TABLET BY MOUTH EVERY DAY 30 tablet 0  . Choline Fenofibrate (FENOFIBRIC ACID) 135 MG CPDR TAKE 1 CAPSULE BY MOUTH EVERY DAY 90 capsule 1  . LamoTRIgine (LAMICTAL PO) Take 100 mg by mouth daily.     Marland Kitchen lisinopril-hydrochlorothiazide (PRINZIDE,ZESTORETIC) 20-12.5 MG tablet TAKE 1 TABLET BY MOUTH EVERY DAY 90 tablet 1  . LORazepam (ATIVAN) 0.5 MG tablet Take 0.5 mg by mouth every 8 (eight) hours as needed for anxiety.    . topiramate (TOPAMAX) 50 MG tablet Take 1 tablet (50 mg total) by mouth daily. (Patient not taking: Reported on 05/02/2018) 30 tablet 0   No current facility-administered medications on file prior to visit.     PAST MEDICAL HISTORY: Past Medical History:  Diagnosis Date  . Bipolar disorder (HCC)   . Constipation   . Depression    bipolar depression  .  Hypercholesteremia   . Hypertension   . Joint pain   . Knee pain   . Sleep apnea     PAST SURGICAL HISTORY: Past Surgical History:  Procedure Laterality Date  . ADENOIDECTOMY    . KNEE SURGERY      SOCIAL HISTORY: Social History   Tobacco Use  . Smoking status: Never Smoker  . Smokeless tobacco: Never Used  Substance Use Topics  . Alcohol use: Yes    Alcohol/week: 3.0 standard drinks    Types: 1 Glasses of wine, 1 Cans of beer, 1 Shots of liquor per week  . Drug use: No    FAMILY HISTORY: Family History  Problem Relation Age of Onset  . Hyperlipidemia Mother   . Hypertension Mother   . Depression Mother   . Anxiety disorder Mother   . Obesity Mother   . Heart attack Father   .  Hyperlipidemia Father   . Hypertension Father   . Obesity Father   . Sleep apnea Father   . Diabetes Maternal Grandmother     ROS: Review of Systems  Constitutional: Positive for weight loss.  Gastrointestinal: Negative for nausea and vomiting.  Musculoskeletal:       Negative muscle weakness  Psychiatric/Behavioral: Positive for depression. Negative for suicidal ideas.    PHYSICAL EXAM: Blood pressure 133/81, pulse 87, height 5\' 10"  (1.778 m), weight 297 lb (134.7 kg), SpO2 98 %. Body mass index is 42.62 kg/m. Physical Exam  Constitutional: He is oriented to person, place, and time. He appears well-developed and well-nourished.  Cardiovascular: Normal rate.  Pulmonary/Chest: Effort normal.  Musculoskeletal: Normal range of motion.  Neurological: He is oriented to person, place, and time.  Skin: Skin is warm and dry.  Psychiatric: He has a normal mood and affect. His behavior is normal.  Vitals reviewed.   RECENT LABS AND TESTS: BMET    Component Value Date/Time   NA 139 03/30/2018 0913   K 4.3 03/30/2018 0913   CL 102 03/30/2018 0913   CO2 21 03/30/2018 0913   GLUCOSE 89 03/30/2018 0913   BUN 12 03/30/2018 0913   CREATININE 0.98 03/30/2018 0913   CALCIUM 10.0 03/30/2018 0913   GFRNONAA 96 03/30/2018 0913   GFRAA 111 03/30/2018 0913   Lab Results  Component Value Date   HGBA1C 5.3 03/30/2018   HGBA1C 5.3 02/13/2018   HGBA1C 5.2 08/02/2016   Lab Results  Component Value Date   INSULIN 18.1 03/30/2018   CBC    Component Value Date/Time   WBC 6.7 03/30/2018 0913   RBC 4.88 03/30/2018 0913   HGB 14.7 03/30/2018 0913   HCT 44.0 03/30/2018 0913   PLT 332 02/13/2018 0849   MCV 90 03/30/2018 0913   MCH 30.1 03/30/2018 0913   MCHC 33.4 03/30/2018 0913   RDW 13.1 03/30/2018 0913   LYMPHSABS 2.0 03/30/2018 0913   EOSABS 0.3 03/30/2018 0913   BASOSABS 0.0 03/30/2018 0913   Iron/TIBC/Ferritin/ %Sat No results found for: IRON, TIBC, FERRITIN,  IRONPCTSAT Lipid Panel     Component Value Date/Time   CHOL 204 (H) 03/30/2018 0913   TRIG 153 (H) 03/30/2018 0913   HDL 48 03/30/2018 0913   CHOLHDL 3.9 02/13/2018 0849   LDLCALC 125 (H) 03/30/2018 0913   Hepatic Function Panel     Component Value Date/Time   PROT 7.3 03/30/2018 0913   ALBUMIN 4.9 03/30/2018 0913   AST 23 03/30/2018 0913   ALT 36 03/30/2018 0913   ALKPHOS 35 (L) 03/30/2018  0913   BILITOT 0.4 03/30/2018 0913      Component Value Date/Time   TSH 2.700 03/30/2018 0913   TSH 3.450 02/13/2018 0849   TSH 3.230 01/11/2017 0920  Results for MCGUIRE, GASPARYAN" (MRN [id number]) as of 05/03/2018 18:10  Ref. Range 03/30/2018 09:13  Vitamin D, 25-Hydroxy Latest Ref Range: 30.0 - 100.0 ng/mL 37.8    ASSESSMENT AND PLAN: Vitamin D deficiency - Plan: Vitamin D, Ergocalciferol, (DRISDOL) 50000 units CAPS capsule  Other depression - with emotional eating  Class 3 severe obesity with serious comorbidity and body mass index (BMI) of 40.0 to 44.9 in adult, unspecified obesity type (HCC)  PLAN:  Vitamin D Deficiency Corey Martin was informed that low vitamin D levels contributes to fatigue and are associated with obesity, breast, and colon cancer. Corey Martin agrees to continue taking prescription Vit D @50 ,000 IU every 3 days #10 and we will refill for 1 month. He will follow up for routine testing of vitamin D, at least 2-3 times per year. He was informed of the risk of over-replacement of vitamin D and agrees to not increase his dose unless he discusses this with [place] first. Corey Martin agrees to follow up with our clinic in 4 weeks.  Depression with Emotional Eating Behaviors We discussed behavior modification techniques today to help Corey Martin deal with his emotional eating and depression. Corey Martin will hold topiramate for now and he agrees to follow up with our clinic in 4 weeks.  Obesity Corey Martin is currently in the action stage of change. As such, his goal is to continue with weight loss efforts He has  agreed to follow the Category 4 plan Corey Martin has been instructed to work up to a goal of 150 minutes of combined cardio and strengthening exercise per week for weight loss and overall health benefits. We discussed the following Behavioral Modification Strategies today: increasing lean protein intake, decreasing simple carbohydrates, decrease eating out and work on meal planning and easy cooking plans, and better snacking choices   Corey Martin has agreed to follow up with our clinic in 4 weeks. He was informed of the importance of frequent follow up visits to maximize his success with intensive lifestyle modifications for his multiple health conditions.   OBESITY BEHAVIORAL INTERVENTION VISIT  Today's visit was # 3   Starting weight: 315 lbs Starting date: 03/30/18 Today's weight : 297 lbs Today's date: 05/02/2018 Total lbs lost to date: 74    ASK: We discussed the diagnosis of obesity with Corey Martin today and Corey Martin agreed to give [place] permission to discuss obesity behavioral modification therapy today.  ASSESS: Jaryan has the diagnosis of obesity and his BMI today is 42.61 Corey Martin is in the action stage of change   ADVISE: [place] was educated on the multiple health risks of obesity as well as the benefit of weight loss to improve his health. He was advised of the need for long term treatment and the importance of lifestyle modifications to improve his current health and to decrease his risk of future health problems.  AGREE: Multiple dietary modification options and treatment options were discussed and  Corey Martin agreed to follow the recommendations documented in the above note.  ARRANGE: Corey Martin was educated on the importance of frequent visits to treat obesity as outlined per CMS and USPSTF guidelines and agreed to schedule his next follow up appointment today.  I, Burt Knack, am acting as transcriptionist for Quillian Quince, MD  I have reviewed the above documentation for accuracy and completeness, and  I  agree with the above. -Quillian Quince, MD

## 2018-05-05 ENCOUNTER — Other Ambulatory Visit (INDEPENDENT_AMBULATORY_CARE_PROVIDER_SITE_OTHER): Payer: Self-pay | Admitting: Family Medicine

## 2018-05-05 DIAGNOSIS — F319 Bipolar disorder, unspecified: Secondary | ICD-10-CM

## 2018-05-16 ENCOUNTER — Other Ambulatory Visit (INDEPENDENT_AMBULATORY_CARE_PROVIDER_SITE_OTHER): Payer: Self-pay | Admitting: Family Medicine

## 2018-05-16 DIAGNOSIS — E559 Vitamin D deficiency, unspecified: Secondary | ICD-10-CM

## 2018-05-18 ENCOUNTER — Other Ambulatory Visit (INDEPENDENT_AMBULATORY_CARE_PROVIDER_SITE_OTHER): Payer: Self-pay | Admitting: Family Medicine

## 2018-05-18 DIAGNOSIS — E559 Vitamin D deficiency, unspecified: Secondary | ICD-10-CM

## 2018-05-25 ENCOUNTER — Other Ambulatory Visit: Payer: Self-pay | Admitting: Family Medicine

## 2018-05-25 DIAGNOSIS — F4323 Adjustment disorder with mixed anxiety and depressed mood: Secondary | ICD-10-CM

## 2018-05-29 ENCOUNTER — Encounter (INDEPENDENT_AMBULATORY_CARE_PROVIDER_SITE_OTHER): Payer: Self-pay | Admitting: Family Medicine

## 2018-05-29 MED ORDER — BUPROPION HCL ER (XL) 300 MG PO TB24: 300.0000 mg | ORAL_TABLET | Freq: Every day | ORAL | 0 refills | Status: DC

## 2018-05-30 ENCOUNTER — Ambulatory Visit (INDEPENDENT_AMBULATORY_CARE_PROVIDER_SITE_OTHER): Payer: BLUE CROSS/BLUE SHIELD | Admitting: Family Medicine

## 2018-05-30 VITALS — BP 123/75 | HR 91 | Temp 98.1°F | Ht 70.0 in | Wt 290.0 lb

## 2018-05-30 DIAGNOSIS — E559 Vitamin D deficiency, unspecified: Secondary | ICD-10-CM

## 2018-05-30 DIAGNOSIS — Z6841 Body Mass Index (BMI) 40.0 and over, adult: Secondary | ICD-10-CM | POA: Diagnosis not present

## 2018-05-31 NOTE — Progress Notes (Signed)
Office: [phone]  /  Fax: [phone]   HPI:   Chief Complaint: OBESITY Corey Martin is here to discuss his progress with his obesity treatment plan. He is on the Category 4 plan and is following his eating plan approximately 73.4 % of the time. He states he is exercising 0 minutes 0 times per week. [name] continues to do well with weight loss. He is frustrated that he lost most of his weight in the first 2 weeks but maintained the second week of the 2 weeks. He is also having trouble with insurance covering his office visit, but apply his counseling to his deductable.  His weight is 290 lb (131.5 kg) today and has had a weight loss of 7 pounds over a period of 4 weeks since his last visit. He has lost 25 lbs since starting treatment with [place].  Vitamin D Deficiency Cartel has a diagnosis of vitamin D deficiency. He is on Vit D but he is missing doses frequently. He has at least 3 month supply when he is given 1 month at a time from [place]. Last Vit D level was not at goal. He notes fatigue and denies nausea, vomiting or muscle weakness.  ALLERGIES: No Known Allergies  MEDICATIONS: Current Outpatient Medications on File Prior to Visit  Medication Sig Dispense Refill  . amLODipine (NORVASC) 5 MG tablet TAKE 2 TABLETS BY MOUTH EVERY DAY 180 tablet 0  . buPROPion (WELLBUTRIN XL) 300 MG 24 hr tablet Take 1 tablet (300 mg total) by mouth daily. 30 tablet 0  . Choline Fenofibrate (FENOFIBRIC ACID) 135 MG CPDR TAKE 1 CAPSULE BY MOUTH EVERY DAY 90 capsule 1  . LamoTRIgine (LAMICTAL PO) Take 100 mg by mouth daily.     Marland Kitchen lisinopril-hydrochlorothiazide (PRINZIDE,ZESTORETIC) 20-12.5 MG tablet TAKE 1 TABLET BY MOUTH EVERY DAY 90 tablet 1  . LORazepam (ATIVAN) 0.5 MG tablet Take 0.5 mg by mouth every 8 (eight) hours as needed for anxiety.    . topiramate (TOPAMAX) 50 MG tablet TAKE 1 TABLET BY MOUTH EVERY DAY 30 tablet 0  . Vitamin D, Ergocalciferol, (DRISDOL) 50000 units CAPS capsule Take 1 capsule (50,000 Units  total) by mouth every 3 (three) days. 10 capsule 0   No current facility-administered medications on file prior to visit.     PAST MEDICAL HISTORY: Past Medical History:  Diagnosis Date  . Bipolar disorder (HCC)   . Constipation   . Depression    bipolar depression  . Hypercholesteremia   . Hypertension   . Joint pain   . Knee pain   . Sleep apnea     PAST SURGICAL HISTORY: Past Surgical History:  Procedure Laterality Date  . ADENOIDECTOMY    . KNEE SURGERY      SOCIAL HISTORY: Social History   Tobacco Use  . Smoking status: Never Smoker  . Smokeless tobacco: Never Used  Substance Use Topics  . Alcohol use: Yes    Alcohol/week: 3.0 standard drinks    Types: 1 Glasses of wine, 1 Cans of beer, 1 Shots of liquor per week  . Drug use: No    FAMILY HISTORY: Family History  Problem Relation Age of Onset  . Hyperlipidemia Mother   . Hypertension Mother   . Depression Mother   . Anxiety disorder Mother   . Obesity Mother   . Heart attack Father   . Hyperlipidemia Father   . Hypertension Father   . Obesity Father   . Sleep apnea Father   . Diabetes Maternal  Grandmother     ROS: Review of Systems  Constitutional: Positive for malaise/fatigue and weight loss.  Gastrointestinal: Negative for nausea and vomiting.  Musculoskeletal:       Negative muscle weakness    PHYSICAL EXAM: Blood pressure 123/75, pulse 91, temperature 98.1 F (36.7 C), temperature source Oral, height 5\' 10"  (1.778 m), weight 290 lb (131.5 kg), SpO2 98 %. Body mass index is 41.61 kg/m. Physical Exam  Constitutional: He is oriented to person, place, and time. He appears well-developed and well-nourished.  Cardiovascular: Normal rate.  Pulmonary/Chest: Effort normal.  Musculoskeletal: Normal range of motion.  Neurological: He is oriented to person, place, and time.  Skin: Skin is warm and dry.  Psychiatric: He has a normal mood and affect. His behavior is normal.  Vitals  reviewed.   RECENT LABS AND TESTS: BMET    Component Value Date/Time   NA 139 03/30/2018 0913   K 4.3 03/30/2018 0913   CL 102 03/30/2018 0913   CO2 21 03/30/2018 0913   GLUCOSE 89 03/30/2018 0913   BUN 12 03/30/2018 0913   CREATININE 0.98 03/30/2018 0913   CALCIUM 10.0 03/30/2018 0913   GFRNONAA 96 03/30/2018 0913   GFRAA 111 03/30/2018 0913   Lab Results  Component Value Date   HGBA1C 5.3 03/30/2018   HGBA1C 5.3 02/13/2018   HGBA1C 5.2 08/02/2016   Lab Results  Component Value Date   INSULIN 18.1 03/30/2018   CBC    Component Value Date/Time   WBC 6.7 03/30/2018 0913   RBC 4.88 03/30/2018 0913   HGB 14.7 03/30/2018 0913   HCT 44.0 03/30/2018 0913   PLT 332 02/13/2018 0849   MCV 90 03/30/2018 0913   MCH 30.1 03/30/2018 0913   MCHC 33.4 03/30/2018 0913   RDW 13.1 03/30/2018 0913   LYMPHSABS 2.0 03/30/2018 0913   EOSABS 0.3 03/30/2018 0913   BASOSABS 0.0 03/30/2018 0913   Iron/TIBC/Ferritin/ %Sat No results found for: IRON, TIBC, FERRITIN, IRONPCTSAT Lipid Panel     Component Value Date/Time   CHOL 204 (H) 03/30/2018 0913   TRIG 153 (H) 03/30/2018 0913   HDL 48 03/30/2018 0913   CHOLHDL 3.9 02/13/2018 0849   LDLCALC 125 (H) 03/30/2018 0913   Hepatic Function Panel     Component Value Date/Time   PROT 7.3 03/30/2018 0913   ALBUMIN 4.9 03/30/2018 0913   AST 23 03/30/2018 0913   ALT 36 03/30/2018 0913   ALKPHOS 35 (L) 03/30/2018 0913   BILITOT 0.4 03/30/2018 0913      Component Value Date/Time   TSH 2.700 03/30/2018 0913   TSH 3.450 02/13/2018 0849   TSH 3.230 01/11/2017 0920  Results for [employer] "KYLE" (MRN [id number]) as of 05/31/2018 15:35  Ref. Range 03/30/2018 09:13  Vitamin D, 25-Hydroxy Latest Ref Range: 30.0 - 100.0 ng/mL 37.8    ASSESSMENT AND PLAN: Vitamin D deficiency  Class 3 severe obesity with serious comorbidity and body mass index (BMI) of 40.0 to 44.9 in adult, unspecified obesity type (HCC)  PLAN:  Vitamin D  Deficiency Dalen was informed that low vitamin D levels contributes to fatigue and are associated with obesity, breast, and colon cancer. Oran agrees to continue taking prescription Vit D @50 ,000 IU every 3 days. He was encouraged to take his Vit D as prescribed and he will recheck labs in January. He will follow up for routine testing of vitamin D, at least 2-3 times per year. He was informed of the risk of over-replacement of  vitamin D and agrees to not increase his dose unless he discusses this with [place] first. Iram agrees to follow up with our clinic in 2 months.  I spent > than 50% of the 25 minute visit on counseling as documented in the note.  Obesity Saeed is currently in the action stage of change. As such, his goal is to continue with weight loss efforts He has agreed to follow the Category 4 plan Loyalty has been instructed to work up to a goal of 150 minutes of combined cardio and strengthening exercise per week for weight loss and overall health benefits. We discussed the following Behavioral Modification Strategies today: increasing lean protein intake, decreasing simple carbohydrates, work on meal planning and easy cooking plans, holiday eating strategies, and travel eating strategies We discussed billing and coding issues with Jafari and how he needs to know his own insurance and their coverage. We discussed his angry conversation with our office administrator and assured him that we are doing everything we can to help his insurance cover his visits, but we cannot falsify our billing. I also informed my staff needs to be treated with respect and we would not accept yelling or cursing at my staff.  Murat has agreed to follow up with our clinic in 2 months. He was informed of the importance of frequent follow up visits to maximize his success with intensive lifestyle modifications for his multiple health conditions.   OBESITY BEHAVIORAL INTERVENTION VISIT  Today's visit was # 4   Starting weight:  315 lbs Starting date: 03/30/18 Today's weight : 290 lbs  Today's date: 05/30/2018 Total lbs lost to date: 25    ASK: We discussed the diagnosis of obesity with [employer] today and Aveion agreed to give [place] permission to discuss obesity behavioral modification therapy today.  ASSESS: Marquett has the diagnosis of obesity and his BMI today is 41.61 Veron is in the action stage of change   ADVISE: Imer was educated on the multiple health risks of obesity as well as the benefit of weight loss to improve his health. He was advised of the need for long term treatment and the importance of lifestyle modifications to improve his current health and to decrease his risk of future health problems.  AGREE: Multiple dietary modification options and treatment options were discussed and  Cheveyo agreed to follow the recommendations documented in the above note.  ARRANGE: Marcques was educated on the importance of frequent visits to treat obesity as outlined per CMS and USPSTF guidelines and agreed to schedule his next follow up appointment today.  I, Burt Knack, am acting as transcriptionist for Quillian Quince, MD  I have reviewed the above documentation for accuracy and completeness, and I agree with the above. -Quillian Quince, MD

## 2018-06-09 ENCOUNTER — Ambulatory Visit (INDEPENDENT_AMBULATORY_CARE_PROVIDER_SITE_OTHER): Payer: BLUE CROSS/BLUE SHIELD

## 2018-06-09 VITALS — BP 120/72 | HR 86 | Temp 98.9°F

## 2018-06-09 DIAGNOSIS — Z23 Encounter for immunization: Secondary | ICD-10-CM

## 2018-06-09 NOTE — Progress Notes (Signed)
Pt here for influenza vaccine.  Screening questionnaire reviewed, VIS provided to patient, and any/all patient questions answered.  T. Nelson, CMA  

## 2018-07-01 ENCOUNTER — Other Ambulatory Visit: Payer: Self-pay | Admitting: Family Medicine

## 2018-07-01 DIAGNOSIS — E559 Vitamin D deficiency, unspecified: Secondary | ICD-10-CM

## 2018-08-01 ENCOUNTER — Ambulatory Visit (INDEPENDENT_AMBULATORY_CARE_PROVIDER_SITE_OTHER): Payer: BLUE CROSS/BLUE SHIELD | Admitting: Family Medicine

## 2018-08-02 ENCOUNTER — Encounter (INDEPENDENT_AMBULATORY_CARE_PROVIDER_SITE_OTHER): Payer: Self-pay

## 2018-08-02 ENCOUNTER — Ambulatory Visit (INDEPENDENT_AMBULATORY_CARE_PROVIDER_SITE_OTHER): Payer: BLUE CROSS/BLUE SHIELD | Admitting: Family Medicine

## 2018-08-02 ENCOUNTER — Encounter (INDEPENDENT_AMBULATORY_CARE_PROVIDER_SITE_OTHER): Payer: Self-pay | Admitting: Family Medicine

## 2018-08-02 VITALS — BP 127/79 | HR 95 | Temp 98.1°F | Ht 70.0 in | Wt 272.0 lb

## 2018-08-02 DIAGNOSIS — Z6839 Body mass index (BMI) 39.0-39.9, adult: Secondary | ICD-10-CM | POA: Diagnosis not present

## 2018-08-02 DIAGNOSIS — E559 Vitamin D deficiency, unspecified: Secondary | ICD-10-CM

## 2018-08-02 MED ORDER — VITAMIN D (ERGOCALCIFEROL) 1.25 MG (50000 UNIT) PO CAPS: 50000.0000 [IU] | ORAL_CAPSULE | ORAL | 1 refills | Status: DC

## 2018-08-03 DIAGNOSIS — E559 Vitamin D deficiency, unspecified: Secondary | ICD-10-CM | POA: Diagnosis not present

## 2018-08-03 NOTE — Progress Notes (Signed)
Office: [phone]  /  Fax: [phone]   HPI:   Chief Complaint: OBESITY Corey Martin is here to discuss his progress with his obesity treatment plan. He is on the Category 4 plan and is following his eating plan approximately 68 % of the time. He states he is exercising 0 minutes 0 times per week. Corey Martin has done well with weight loss in the last 2 months. He eats out frequently, but is doing well with meal planning and making better choices.  His weight is 272 lb (123.4 kg) today and has had a weight loss of 18 pounds over a period of 9 weeks since his last visit. He has lost 43 lbs since starting treatment with us.  Vitamin D deficiency Corey Martin has a diagnosis of vitamin D deficiency. He is currently taking vit D 2 times per week by us, but Dr. Sharee Holsterpalski refilled his vitamin D last month a 1 time per week. He would like to know how to take his vitamin D. Corey Martin denies nausea, vomiting, or muscle weakness.  ASSESSMENT AND PLAN:  Vitamin D deficiency - Plan: Vitamin D, Ergocalciferol, (DRISDOL) 1.25 MG (50000 UT) CAPS capsule, VITAMIN D 25 Hydroxy (Vit-D Deficiency, Fractures)  Class 2 severe obesity with serious comorbidity and body mass index (BMI) of 39.0 to 39.9 in adult, unspecified obesity type (HCC)  PLAN:  Vitamin D Deficiency Corey Martin was informed that low vitamin D levels contributes to fatigue and are associated with obesity, breast, and colon cancer. He agrees to change  to taking prescription Vit D @50 ,000 IU every 3 days #10 with 1 refill and will follow up for routine testing of vitamin D, at least 2-3 times per year. He was informed of the risk of over-replacement of vitamin D and agrees to not increase his dose unless he discusses this with us first. We will order a vitamin D level today. Corey Martin was advised that if his vitamin D level was over 50 to decrease his dose to 1 time per week. He agrees with this plan and to follow up in 8 weeks.  I spent > than 50% of the 25 minute visit on counseling  as documented in the note.  Obesity Corey Martin is currently in the action stage of change. As such, his goal is to continue with weight loss efforts. He has agreed to follow the Category 4 plan. Corey Martin has been instructed to work up to a goal of 150 minutes of combined cardio and strengthening exercise per week for weight loss and overall health benefits. An exercise band was given for strengthening exercises and to start at 10 minutes per day. We discussed the following Behavioral Modification Strategies today: increasing lean protein intake, decreasing simple carbohydrates, and planning for success.   Corey Martin has agreed to follow up with our clinic in 8 weeks. He was informed of the importance of frequent follow up visits to maximize his success with intensive lifestyle modifications for his multiple health conditions.  ALLERGIES: No Known Allergies  MEDICATIONS: Current Outpatient Medications on File Prior to Visit  Medication Sig Dispense Refill  . amLODipine (NORVASC) 5 MG tablet TAKE 2 TABLETS BY MOUTH EVERY DAY 180 tablet 0  . buPROPion (WELLBUTRIN XL) 300 MG 24 hr tablet Take 1 tablet (300 mg total) by mouth daily. 30 tablet 0  . Choline Fenofibrate (FENOFIBRIC ACID) 135 MG CPDR TAKE 1 CAPSULE BY MOUTH EVERY DAY 90 capsule 1  . LamoTRIgine (LAMICTAL PO) Take 100 mg by mouth daily.     .Marland Kitchen  lisinopril-hydrochlorothiazide (PRINZIDE,ZESTORETIC) 20-12.5 MG tablet TAKE 1 TABLET BY MOUTH EVERY DAY 90 tablet 1  . LORazepam (ATIVAN) 0.5 MG tablet Take 0.5 mg by mouth every 8 (eight) hours as needed for anxiety.     No current facility-administered medications on file prior to visit.     PAST MEDICAL HISTORY: Past Medical History:  Diagnosis Date  . Bipolar disorder (HCC)   . Constipation   . Depression    bipolar depression  . Hypercholesteremia   . Hypertension   . Joint pain   . Knee pain   . Sleep apnea     PAST SURGICAL HISTORY: Past Surgical History:  Procedure Laterality Date  .  ADENOIDECTOMY    . KNEE SURGERY      SOCIAL HISTORY: Social History   Tobacco Use  . Smoking status: Never Smoker  . Smokeless tobacco: Never Used  Substance Use Topics  . Alcohol use: Yes    Alcohol/week: 3.0 standard drinks    Types: 1 Glasses of wine, 1 Cans of beer, 1 Shots of liquor per week  . Drug use: No    FAMILY HISTORY: Family History  Problem Relation Age of Onset  . Hyperlipidemia Mother   . Hypertension Mother   . Depression Mother   . Anxiety disorder Mother   . Obesity Mother   . Heart attack Father   . Hyperlipidemia Father   . Hypertension Father   . Obesity Father   . Sleep apnea Father   . Diabetes Maternal Grandmother     ROS: Review of Systems  Constitutional: Positive for weight loss.  Gastrointestinal: Negative for nausea and vomiting.  Musculoskeletal:       Negative for muscle weakness.    PHYSICAL EXAM: Blood pressure 127/79, pulse 95, temperature 98.1 F (36.7 C), temperature source Oral, height 5\' 10"  (1.778 m), weight 272 lb (123.4 kg), SpO2 97 %. Body mass index is 39.03 kg/m. Physical Exam Vitals signs reviewed.  Constitutional:      Appearance: Normal appearance. He is obese.  Cardiovascular:     Rate and Rhythm: Normal rate.  Pulmonary:     Effort: Pulmonary effort is normal.  Musculoskeletal: Normal range of motion.  Skin:    General: Skin is warm and dry.  Neurological:     Mental Status: He is alert and oriented to person, place, and time.  Psychiatric:        Mood and Affect: Mood normal.        Behavior: Behavior normal.     RECENT LABS AND TESTS: BMET    Component Value Date/Time   NA 139 03/30/2018 0913   K 4.3 03/30/2018 0913   CL 102 03/30/2018 0913   CO2 21 03/30/2018 0913   GLUCOSE 89 03/30/2018 0913   BUN 12 03/30/2018 0913   CREATININE 0.98 03/30/2018 0913   CALCIUM 10.0 03/30/2018 0913   GFRNONAA 96 03/30/2018 0913   GFRAA 111 03/30/2018 0913   Lab Results  Component Value Date   HGBA1C  5.3 03/30/2018   HGBA1C 5.3 02/13/2018   HGBA1C 5.2 08/02/2016   Lab Results  Component Value Date   INSULIN 18.1 03/30/2018   CBC    Component Value Date/Time   WBC 6.7 03/30/2018 0913   RBC 4.88 03/30/2018 0913   HGB 14.7 03/30/2018 0913   HCT 44.0 03/30/2018 0913   PLT 332 02/13/2018 0849   MCV 90 03/30/2018 0913   MCH 30.1 03/30/2018 0913   MCHC 33.4 03/30/2018 0913  RDW 13.1 03/30/2018 0913   LYMPHSABS 2.0 03/30/2018 0913   EOSABS 0.3 03/30/2018 0913   BASOSABS 0.0 03/30/2018 0913   Iron/TIBC/Ferritin/ %Sat No results found for: IRON, TIBC, FERRITIN, IRONPCTSAT Lipid Panel     Component Value Date/Time   CHOL 204 (H) 03/30/2018 0913   TRIG 153 (H) 03/30/2018 0913   HDL 48 03/30/2018 0913   CHOLHDL 3.9 02/13/2018 0849   LDLCALC 125 (H) 03/30/2018 0913   Hepatic Function Panel     Component Value Date/Time   PROT 7.3 03/30/2018 0913   ALBUMIN 4.9 03/30/2018 0913   AST 23 03/30/2018 0913   ALT 36 03/30/2018 0913   ALKPHOS 35 (L) 03/30/2018 0913   BILITOT 0.4 03/30/2018 0913      Component Value Date/Time   TSH 2.700 03/30/2018 0913   TSH 3.450 02/13/2018 0849   TSH 3.230 01/11/2017 0920   Results for [name]" (MRN [id number]) as of 08/03/2018 07:59  Ref. Range 03/30/2018 09:13  Vitamin D, 25-Hydroxy Latest Ref Range: 30.0 - 100.0 ng/mL 37.8    OBESITY BEHAVIORAL INTERVENTION VISIT  Today's visit was # 5   Starting weight: 315 lbs Starting date: 03/30/18 Today's weight : Weight: 272 lb (123.4 kg)  Today's date: 08/02/2018 Total lbs lost to date: 79  ASK: We discussed the diagnosis of obesity with [employer] today and Swain agreed to give [place] permission to discuss obesity behavioral modification therapy today.  ASSESS: Rykeem has the diagnosis of obesity and his BMI today is 39.0. Odas is in the action stage of change.   ADVISE: Heaton was educated on the multiple health risks of obesity as well as the benefit of weight loss to improve his  health. He was advised of the need for long term treatment and the importance of lifestyle modifications to improve his current health and to decrease his risk of future health problems.  AGREE: Multiple dietary modification options and treatment options were discussed and Reagen agreed to follow the recommendations documented in the above note.  ARRANGE: Torean was educated on the importance of frequent visits to treat obesity as outlined per CMS and USPSTF guidelines and agreed to schedule his next follow up appointment today.  I, Kirke Corin, am acting as transcriptionist for Wilder Glade, MD  I have reviewed the above documentation for accuracy and completeness, and I agree with the above. -Quillian Quince, MD

## 2018-08-04 LAB — VITAMIN D 25 HYDROXY (VIT D DEFICIENCY, FRACTURES): VIT D 25 HYDROXY: 69.6 ng/mL (ref 30.0–100.0)

## 2018-08-16 ENCOUNTER — Ambulatory Visit: Payer: BLUE CROSS/BLUE SHIELD | Admitting: Family Medicine

## 2018-09-26 ENCOUNTER — Encounter (INDEPENDENT_AMBULATORY_CARE_PROVIDER_SITE_OTHER): Payer: Self-pay | Admitting: Family Medicine

## 2018-09-26 ENCOUNTER — Ambulatory Visit (INDEPENDENT_AMBULATORY_CARE_PROVIDER_SITE_OTHER): Payer: BLUE CROSS/BLUE SHIELD | Admitting: Family Medicine

## 2018-09-26 VITALS — BP 114/73 | HR 86 | Ht 70.0 in | Wt 268.0 lb

## 2018-09-26 DIAGNOSIS — Z9189 Other specified personal risk factors, not elsewhere classified: Secondary | ICD-10-CM

## 2018-09-26 DIAGNOSIS — Z6838 Body mass index (BMI) 38.0-38.9, adult: Secondary | ICD-10-CM

## 2018-09-26 DIAGNOSIS — E8881 Metabolic syndrome: Secondary | ICD-10-CM

## 2018-09-26 DIAGNOSIS — E559 Vitamin D deficiency, unspecified: Secondary | ICD-10-CM | POA: Diagnosis not present

## 2018-09-26 MED ORDER — METFORMIN HCL 500 MG PO TABS: 500.0000 mg | ORAL_TABLET | Freq: Every day | ORAL | 0 refills | Status: DC

## 2018-09-26 NOTE — Progress Notes (Signed)
Office: [phone]  /  Fax: [phone]   HPI:   Chief Complaint: OBESITY Corey Martin is here to discuss his progress with his obesity treatment plan. He is on the Category 4 plan and is following his eating plan approximately 64 % of the time. He states he is walking, doing steps and situps for 10-15 minutes 3 times per week. Corey Martin is doing well with weight loss on his Category 4 plan, but he notes increased evening hunger and feeling bored at dinner.  His weight is 268 lb (121.6 kg) today and has had a weight loss of 4 pounds over a period of 8 weeks since his last visit. He has lost 47 lbs since starting treatment with [place].  Insulin Resistance Corey Martin has a diagnosis of insulin resistance based on his elevated fasting insulin level >5. Although Corey Martin's blood glucose readings are still under good control, insulin resistance puts him at greater risk of metabolic syndrome and diabetes. He is not taking metformin currently and he notes increased polyphagia, worse in the evening even on diet prescription. He continues to work on diet and exercise to decrease risk of diabetes.  Vitamin D Deficiency Corey Martin has a diagnosis of vitamin D deficiency. He is stable on prescription Vit D, and level is now at goal on 2 times weekly prescription. He denies nausea, vomiting or muscle weakness.  At risk for cardiovascular disease Corey Martin is at a higher than average risk for cardiovascular disease due to obesity. He currently denies any chest pain.  ASSESSMENT AND PLAN:  Insulin resistance - Plan: metFORMIN (GLUCOPHAGE) 500 MG tablet  Vitamin D deficiency  At risk for heart disease  Class 2 severe obesity with serious comorbidity and body mass index (BMI) of 38.0 to 38.9 in adult, unspecified obesity type (HCC)  PLAN:  Insulin Resistance Corey Martin will continue to work on weight loss, exercise, and decreasing simple carbohydrates in his diet to help decrease the risk of diabetes. We dicussed metformin including benefits  and risks. He was informed that eating too many simple carbohydrates or too many calories at one sitting increases the likelihood of GI side effects. Rajat agrees to start metformin 500 mg q daily #30 with no refills. Morgen agrees to follow up with our clinic in 4 weeks as directed to monitor his progress.  Vitamin D Deficiency Corey Martin was informed that low vitamin D levels contributes to fatigue and are associated with obesity, breast, and colon cancer. Corey Martin agrees to decrease prescription Vit D to 50,000 IU every week #4, no refill needed. He will follow up for routine testing of vitamin D, at least 2-3 times per year. He was informed of the risk of over-replacement of vitamin D and agrees to not increase his dose unless he discusses this with [place] first. Corey Martin agrees to follow up with our clinic in 4 weeks.  Cardiovascular risk counseling Corey Martin was given extended (15 minutes) coronary artery disease prevention counseling today. He is 43 y.o. male and has risk factors for heart disease including obesity. We discussed intensive lifestyle modifications today with an emphasis on specific weight loss instructions and strategies. Pt was also informed of the importance of increasing exercise and decreasing saturated fats to help prevent heart disease.  Obesity Corey Martin is currently in the action stage of change. As such, his goal is to continue with weight loss efforts He has agreed to keep a food journal with 500-700 calories and 50 grams of protein at supper daily and follow the Category 4 plan Corey Martin  has been instructed to work up to a goal of 150 minutes of combined cardio and strengthening exercise per week for weight loss and overall health benefits. We discussed the following Behavioral Modification Strategies today: increasing lean protein intake, decreasing simple carbohydrates  and work on meal planning and easy cooking plans   Corey Martin has agreed to follow up with our clinic in 4 weeks. He was informed of the importance  of frequent follow up visits to maximize his success with intensive lifestyle modifications for his multiple health conditions.  ALLERGIES: No Known Allergies  MEDICATIONS: Current Outpatient Medications on File Prior to Visit  Medication Sig Dispense Refill  . amLODipine (NORVASC) 5 MG tablet TAKE 2 TABLETS BY MOUTH EVERY DAY 180 tablet 0  . buPROPion (WELLBUTRIN XL) 300 MG 24 hr tablet Take 1 tablet (300 mg total) by mouth daily. 30 tablet 0  . Choline Fenofibrate (FENOFIBRIC ACID) 135 MG CPDR TAKE 1 CAPSULE BY MOUTH EVERY DAY 90 capsule 1  . LamoTRIgine (LAMICTAL PO) Take 100 mg by mouth daily.     Marland Kitchen lisinopril-hydrochlorothiazide (PRINZIDE,ZESTORETIC) 20-12.5 MG tablet TAKE 1 TABLET BY MOUTH EVERY DAY 90 tablet 1  . LORazepam (ATIVAN) 0.5 MG tablet Take 0.5 mg by mouth every 8 (eight) hours as needed for anxiety.    . Vitamin D, Ergocalciferol, (DRISDOL) 1.25 MG (50000 UT) CAPS capsule Take 1 capsule (50,000 Units total) by mouth every 3 (three) days. 10 capsule 1   No current facility-administered medications on file prior to visit.     PAST MEDICAL HISTORY: Past Medical History:  Diagnosis Date  . Bipolar disorder (HCC)   . Constipation   . Depression    bipolar depression  . Hypercholesteremia   . Hypertension   . Joint pain   . Knee pain   . Sleep apnea     PAST SURGICAL HISTORY: Past Surgical History:  Procedure Laterality Date  . ADENOIDECTOMY    . KNEE SURGERY      SOCIAL HISTORY: Social History   Tobacco Use  . Smoking status: Never Smoker  . Smokeless tobacco: Never Used  Substance Use Topics  . Alcohol use: Yes    Alcohol/week: 3.0 standard drinks    Types: 1 Glasses of wine, 1 Cans of beer, 1 Shots of liquor per week  . Drug use: No    FAMILY HISTORY: Family History  Problem Relation Age of Onset  . Hyperlipidemia Mother   . Hypertension Mother   . Depression Mother   . Anxiety disorder Mother   . Obesity Mother   . Heart attack Father   .  Hyperlipidemia Father   . Hypertension Father   . Obesity Father   . Sleep apnea Father   . Diabetes Maternal Grandmother     ROS: Review of Systems  Constitutional: Positive for weight loss.  Cardiovascular: Negative for chest pain.  Gastrointestinal: Negative for nausea and vomiting.  Musculoskeletal:       Negative muscle weakness  Endo/Heme/Allergies:       Positive polyphagia    PHYSICAL EXAM: Blood pressure 114/73, pulse 86, height  (1.778 m), weight 268 lb (121.6 kg), SpO2 97 %. Body mass index is 38.45 kg/m. Physical Exam Vitals signs reviewed.  Constitutional:      Appearance: Normal appearance. He is obese.  Cardiovascular:     Rate and Rhythm: Normal rate.     Pulses: Normal pulses.  Pulmonary:     Effort: Pulmonary effort is normal.  Breath sounds: Normal breath sounds.  Musculoskeletal: Normal range of motion.  Skin:    General: Skin is warm and dry.  Neurological:     Mental Status: He is alert and oriented to person, place, and time.  Psychiatric:        Mood and Affect: Mood normal.        Behavior: Behavior normal.     RECENT LABS AND TESTS: BMET    Component Value Date/Time   NA 139 03/30/2018 0913   K 4.3 03/30/2018 0913   CL 102 03/30/2018 0913   CO2 21 03/30/2018 0913   GLUCOSE 89 03/30/2018 0913   BUN 12 03/30/2018 0913   CREATININE 0.98 03/30/2018 0913   CALCIUM 10.0 03/30/2018 0913   GFRNONAA 96 03/30/2018 0913   GFRAA 111 03/30/2018 0913   Lab Results  Component Value Date   HGBA1C 5.3 03/30/2018   HGBA1C 5.3 02/13/2018   HGBA1C 5.2 08/02/2016   Lab Results  Component Value Date   INSULIN 18.1 03/30/2018   CBC    Component Value Date/Time   WBC 6.7 03/30/2018 0913   RBC 4.88 03/30/2018 0913   HGB 14.7 03/30/2018 0913   HCT 44.0 03/30/2018 0913   PLT 332 02/13/2018 0849   MCV 90 03/30/2018 0913   MCH 30.1 03/30/2018 0913   MCHC 33.4 03/30/2018 0913   RDW 13.1 03/30/2018 0913   LYMPHSABS 2.0 03/30/2018  0913   EOSABS 0.3 03/30/2018 0913   BASOSABS 0.0 03/30/2018 0913   Iron/TIBC/Ferritin/ %Sat No results found for: IRON, TIBC, FERRITIN, IRONPCTSAT Lipid Panel     Component Value Date/Time   CHOL 204 (H) 03/30/2018 0913   TRIG 153 (H) 03/30/2018 0913   HDL 48 03/30/2018 0913   CHOLHDL 3.9 02/13/2018 0849   LDLCALC 125 (H) 03/30/2018 0913   Hepatic Function Panel     Component Value Date/Time   PROT 7.3 03/30/2018 0913   ALBUMIN 4.9 03/30/2018 0913   AST 23 03/30/2018 0913   ALT 36 03/30/2018 0913   ALKPHOS 35 (L) 03/30/2018 0913   BILITOT 0.4 03/30/2018 0913      Component Value Date/Time   TSH 2.700 03/30/2018 0913   TSH 3.450 02/13/2018 0849   TSH 3.230 01/11/2017 0920      OBESITY BEHAVIORAL INTERVENTION VISIT  Today's visit was # 6   Starting weight: 315 lbs Starting date: 03/30/18 Today's weight : 268 lbs Today's date: 09/26/2018 Total lbs lost to date: 47    09/26/2018  Height 5\' 10"  (1.778 m)  Weight 268 lb (121.6 kg)  BMI (Calculated) 38.45  BLOOD PRESSURE - SYSTOLIC 114  BLOOD PRESSURE - DIASTOLIC 73   Body Fat % 33.3 %  Total Body Water (lbs) 128.6 lbs     ASK: We discussed the diagnosis of obesity with Keane Police today and Hedley agreed to give [place] permission to discuss obesity behavioral modification therapy today.  ASSESS: Tyvion has the diagnosis of obesity and his BMI today is 38.45 Tucker is in the action stage of change   ADVISE: Keeland was educated on the multiple health risks of obesity as well as the benefit of weight loss to improve his health. He was advised of the need for long term treatment and the importance of lifestyle modifications to improve his current health and to decrease his risk of future health problems.  AGREE: Multiple dietary modification options and treatment options were discussed and  Jabree agreed to follow the recommendations documented in the above  note.  ARRANGE: Kyreece was educated on the importance of frequent visits  to treat obesity as outlined per CMS and USPSTF guidelines and agreed to schedule his next follow up appointment today.  I, Burt Knack, am acting as transcriptionist for Quillian Quince, MD  I have reviewed the above documentation for accuracy and completeness, and I agree with the above. -Quillian Quince, MD

## 2018-10-01 ENCOUNTER — Other Ambulatory Visit: Payer: Self-pay | Admitting: Family Medicine

## 2018-10-01 DIAGNOSIS — I159 Secondary hypertension, unspecified: Secondary | ICD-10-CM

## 2018-10-18 ENCOUNTER — Encounter (INDEPENDENT_AMBULATORY_CARE_PROVIDER_SITE_OTHER): Payer: Self-pay

## 2018-10-18 ENCOUNTER — Other Ambulatory Visit (INDEPENDENT_AMBULATORY_CARE_PROVIDER_SITE_OTHER): Payer: Self-pay | Admitting: Family Medicine

## 2018-10-18 DIAGNOSIS — E8881 Metabolic syndrome: Secondary | ICD-10-CM

## 2018-10-19 ENCOUNTER — Encounter (INDEPENDENT_AMBULATORY_CARE_PROVIDER_SITE_OTHER): Payer: Self-pay

## 2018-10-24 ENCOUNTER — Ambulatory Visit (INDEPENDENT_AMBULATORY_CARE_PROVIDER_SITE_OTHER): Payer: BLUE CROSS/BLUE SHIELD | Admitting: Family Medicine

## 2018-10-29 ENCOUNTER — Other Ambulatory Visit: Payer: Self-pay | Admitting: Family Medicine

## 2018-10-29 DIAGNOSIS — I159 Secondary hypertension, unspecified: Secondary | ICD-10-CM

## 2018-11-29 ENCOUNTER — Other Ambulatory Visit: Payer: Self-pay | Admitting: Family Medicine

## 2018-11-29 DIAGNOSIS — F4323 Adjustment disorder with mixed anxiety and depressed mood: Secondary | ICD-10-CM

## 2018-12-05 DIAGNOSIS — F408 Other phobic anxiety disorders: Secondary | ICD-10-CM | POA: Diagnosis not present

## 2018-12-05 DIAGNOSIS — F3181 Bipolar II disorder: Secondary | ICD-10-CM | POA: Diagnosis not present

## 2018-12-05 DIAGNOSIS — F900 Attention-deficit hyperactivity disorder, predominantly inattentive type: Secondary | ICD-10-CM | POA: Diagnosis not present

## 2018-12-07 ENCOUNTER — Telehealth: Payer: Self-pay | Admitting: Family Medicine

## 2018-12-07 NOTE — Telephone Encounter (Signed)
-----   Message from Melissa D Pulliam, CMA sent at 11/29/2018 10:12 AM EDT ----- °Patient is due for follow up, please call the patient to make an appointment.  30 day supply of medication sent in for the patient.  Thanks. MPulliam, CMA/RT(R) ° °

## 2018-12-07 NOTE — Telephone Encounter (Signed)
Provider allowed 30 refill on wellbutrin & required pt to have a OV / Virtual OV if restrictions not lifted in June for any addt'l refills-- Left pt message to call office at his convenience to set up appt.  --FYI to medical assistant --Fausto Skillern

## 2018-12-19 ENCOUNTER — Other Ambulatory Visit: Payer: Self-pay | Admitting: Family Medicine

## 2018-12-19 DIAGNOSIS — E781 Pure hyperglyceridemia: Secondary | ICD-10-CM

## 2019-01-08 ENCOUNTER — Telehealth: Payer: Self-pay | Admitting: Family Medicine

## 2019-01-08 NOTE — Telephone Encounter (Signed)
-----   Message from Jerilee Field, [place] sent at 11/29/2018 10:12 AM EDT ----- Patient is due for follow up, please call the patient to make an appointment.  30 day supply of medication sent in for the patient.  Thanks. MPulliam, CMA/RT(R)

## 2019-01-08 NOTE — Telephone Encounter (Signed)
Called pt to set up provider required OV, left message requesting call back to schedule appt--glh  --

## 2019-01-11 ENCOUNTER — Ambulatory Visit (INDEPENDENT_AMBULATORY_CARE_PROVIDER_SITE_OTHER): Payer: BC Managed Care – PPO | Admitting: Family Medicine

## 2019-01-11 ENCOUNTER — Encounter: Payer: Self-pay | Admitting: Family Medicine

## 2019-01-11 ENCOUNTER — Other Ambulatory Visit: Payer: Self-pay

## 2019-01-11 VITALS — Temp 97.1°F | Ht 70.0 in | Wt 255.0 lb

## 2019-01-11 DIAGNOSIS — E8881 Metabolic syndrome: Secondary | ICD-10-CM | POA: Insufficient documentation

## 2019-01-11 DIAGNOSIS — E785 Hyperlipidemia, unspecified: Secondary | ICD-10-CM | POA: Insufficient documentation

## 2019-01-11 DIAGNOSIS — E782 Mixed hyperlipidemia: Secondary | ICD-10-CM | POA: Diagnosis not present

## 2019-01-11 DIAGNOSIS — I159 Secondary hypertension, unspecified: Secondary | ICD-10-CM | POA: Diagnosis not present

## 2019-01-11 DIAGNOSIS — E559 Vitamin D deficiency, unspecified: Secondary | ICD-10-CM

## 2019-01-11 DIAGNOSIS — G4730 Sleep apnea, unspecified: Secondary | ICD-10-CM

## 2019-01-11 DIAGNOSIS — E538 Deficiency of other specified B group vitamins: Secondary | ICD-10-CM

## 2019-01-11 DIAGNOSIS — E781 Pure hyperglyceridemia: Secondary | ICD-10-CM

## 2019-01-11 DIAGNOSIS — F4323 Adjustment disorder with mixed anxiety and depressed mood: Secondary | ICD-10-CM

## 2019-01-11 MED ORDER — VITAMIN D (ERGOCALCIFEROL) 1.25 MG (50000 UNIT) PO CAPS: 50000.0000 [IU] | ORAL_CAPSULE | ORAL | 3 refills | Status: DC

## 2019-01-11 MED ORDER — METFORMIN HCL 500 MG PO TABS: 500.0000 mg | ORAL_TABLET | Freq: Every day | ORAL | 0 refills | Status: DC

## 2019-01-11 MED ORDER — LISINOPRIL-HYDROCHLOROTHIAZIDE 20-12.5 MG PO TABS: 1.0000 | ORAL_TABLET | Freq: Every day | ORAL | 1 refills | Status: DC

## 2019-01-11 NOTE — Progress Notes (Signed)
Telehealth office visit note for Corey Martin, D.O- [facility]   I connected with current patient today and verified that I am speaking with the correct person using two identifiers.   . Location of the patient: Home . Location of the provider: Office Only the patient (+/- their family members at pt's discretion) and myself were participating in the encounter    - This visit type was conducted due to national recommendations for restrictions regarding the COVID-19 Pandemic (e.g. social distancing) in an effort to limit this patient's exposure and mitigate transmission in our community.  This format is felt to be most appropriate for this patient at this time.   - The patient did not have access to video technology or had technical difficulties with video requiring transitioning to audio format only. - No physical exam could be performed with this format, beyond that communicated to us by the patient/ family members as noted.   - Additionally my office staff/ schedulers discussed with the patient that there may be a monetary charge related to this service, depending on their medical insurance.   The patient expressed understanding, and agreed to proceed.       History of Present Illness:  Obesity: Lost 66 lbs since last seen 1 year ago.   Has not gained much back at all.     [facility] program [facility] but insurance did not cover the mall and he had to pay for some visits in full..  They also had him on metformin for insulin resistance and to help him with his hunger at night for patient. -He is out and misses it. -Patient states he is not willing to go back at this time and pay that kind of money for visits where he is not weighing in and not getting his blood pressure etc.checked.    Stress/ frustration: of covid.  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  He is under a lot of stress.   His pressures are stressed as well and turning to him for help.  He continues on his Lamictal, Wellbutrin and medications per psychiatry.  Symptoms stable and he denies need for change in medication at this time  HTN:  Bp at home-  120/78;  Has seen dec with wt loss.  No complaints.  HLD:   Elevated LDL and TG back in Sept '19.  Continues fenofibric acid.       Impression and Recommendations:    1. Hypertriglyceridemia   2. Vitamin D deficiency   3. Secondary hypertension   4. Mixed hyperlipidemia   5. Sleep apnea, unspecified type   6. Mood D/o    7. B12 deficiency   8. Insulin resistance     -Patient will continue monitoring blood pressure at home.  In 4 months he will need recheck of labs.  He declines coming in today or anytime in the near future due to COVID.  Fill blood pressure medicines.  Blood pressure stable.  Refill vitamin D.  Will need recheck of levels with me next available he can.  Last checked 9 of 2019.  Was at 37.8 at the time.  Hyperlipidemia: Last checked 9 months ago triglyceride 153 and LDL 125.  However, patient has lost 66 pounds since then and we are both curious as to what his cholesterol is now.  Patient declines coming in  in the near future for lab work due to [place].  Obesity: Encouraged to continue his weight loss.  Congratulated on his success.  Told him that I would refill his metformin for him and monitor him here if needed.  Encouraged him to return to the clinic once they open up for office visits since he had such success and enjoyed it very much.  - As part of my medical decision making, I reviewed the following data within the [place] History obtained from pt /family, CMA notes reviewed and incorporated if applicable, Labs reviewed, Radiograph/ tests reviewed if applicable and OV notes from prior OV's with me, as well as other specialists she/he has seen since seeing me last, were all reviewed and used in my medical decision  making process today.   - Additionally, discussion had with patient regarding txmnt plan, and their biases/concerns about that plan were used in my medical decision making today.   - The patient agreed with the plan and demonstrated an understanding of the instructions.   No barriers to understanding were identified.   - Red flag symptoms and signs discussed in detail.  Patient expressed understanding regarding what to do in case of emergency\ urgent symptoms.  The patient was advised to call back or seek an in-person evaluation if the symptoms worsen or if the condition fails to improve as anticipated.   Return for 4 months for hypertension, cholesterol, weight loss, insulin resistance etc.:also needs CPE future.    Orders Placed This Encounter  Procedures  . CBC with Differential/Platelet  . Vitamin B12  . Lipid Panel w/reflex Direct LDL  . Insulin, random    Meds ordered this encounter  Medications  . Vitamin D, Ergocalciferol, (DRISDOL) 1.25 MG (50000 UT) CAPS capsule    Sig: Take 1 capsule (50,000 Units total) by mouth every 3 (three) days.    Dispense:  12 capsule    Refill:  3  . lisinopril-hydrochlorothiazide (ZESTORETIC) 20-12.5 MG tablet    Sig: Take 1 tablet by mouth daily. (needs OV for RF)    Dispense:  90 tablet    Refill:  1  . metFORMIN (GLUCOPHAGE) 500 MG tablet    Sig: Take 1 tablet (500 mg total) by mouth daily at 2 PM.    Dispense:  90 tablet    Refill:  0    Medications Discontinued During This Encounter  Medication Reason  . Vitamin D, Ergocalciferol, (DRISDOL) 1.25 MG (50000 UT) CAPS capsule Reorder  . lisinopril-hydrochlorothiazide (PRINZIDE,ZESTORETIC) 20-12.5 MG tablet Reorder  . metFORMIN (GLUCOPHAGE) 500 MG tablet Reorder     I provided 22 minutes of non-face-to-face time during this encounter,with over 50% of the time in direct counseling on patients medical conditions/ medical concerns.  Additional time was spent with charting and coordination of  care after the actual visit commenced.   Note:  This note was prepared with assistance of Dragon voice recognition software. Occasional wrong-word or sound-a-like substitutions may have occurred due to the inherent limitations of voice recognition software.  Mellody Dance, DO Mellody Dance, DO 01/11/2019 9:56 AM     Patient Care Team    Relationship Specialty Notifications Start End  Mellody Dance, DO PCP - General Family Medicine  07/29/16   Eliseo Gum, MD  Psychiatry  01/13/17    Comment: seeing a doc there- not sure of name     -Vitals obtained; medications/ allergies reconciled;  personal medical, social, Sx etc.histories were updated by CMA, reviewed by me  and are reflected in chart   Patient Active Problem List   Diagnosis Date Noted  . HLD (hyperlipidemia) 01/11/2019    Priority: High  . Mood disorder (HCC) 01/13/2017    Priority: High  . HTN (hypertension) 07/29/2016    Priority: High  . Obesity, Class III, BMI 40-49.9 (morbid obesity) (HCC) 07/29/2016    Priority: High  . Hypertriglyceridemia 07/29/2016    Priority: High  . Generalized OA- knee 07/29/2016    Priority: Medium  . Vitamin D deficiency 01/04/2017    Priority: Low  . History of meniscal tear- L knee 07/29/2016    Priority: Low  . B12 deficiency 01/11/2019  . Mood D/o  01/11/2019  . Insulin resistance 01/11/2019  . Sleep apnea   . Secondary hypertension 12/29/2017  . Elevated LDL cholesterol level 01/13/2017  . Influenza A 09/03/2016     Current Meds  Medication Sig  . amLODipine (NORVASC) 5 MG tablet TAKE 2 TABLETS BY MOUTH EVERY DAY  . buPROPion (WELLBUTRIN XL) 300 MG 24 hr tablet Take 1 tablet (300 mg total) by mouth daily. Needs office visit  . Choline Fenofibrate (FENOFIBRIC ACID) 135 MG CPDR Take 1 tablet by mouth daily. Needs office visit for further refills  . LamoTRIgine (LAMICTAL PO) Take 100 mg by mouth daily.   Marland Kitchen. lisinopril-hydrochlorothiazide (ZESTORETIC) 20-12.5  MG tablet Take 1 tablet by mouth daily. (needs OV for RF)  . LORazepam (ATIVAN) 0.5 MG tablet Take 0.5 mg by mouth every 8 (eight) hours as needed for anxiety.  . Vitamin D, Ergocalciferol, (DRISDOL) 1.25 MG (50000 UT) CAPS capsule Take 1 capsule (50,000 Units total) by mouth every 3 (three) days.  . [DISCONTINUED] lisinopril-hydrochlorothiazide (PRINZIDE,ZESTORETIC) 20-12.5 MG tablet TAKE 1 TABLET BY MOUTH EVERY DAY  . [DISCONTINUED] Vitamin D, Ergocalciferol, (DRISDOL) 1.25 MG (50000 UT) CAPS capsule Take 1 capsule (50,000 Units total) by mouth every 3 (three) days.     Allergies:  No Known Allergies   ROS:  See above HPI for pertinent positives and negatives   Objective:   Temperature (!) 97.1 F (36.2 C), height 5\' 10"  (1.778 m), weight 255 lb (115.7 kg).  (if some vitals are omitted, this means that patient was UNABLE to obtain them even though they were asked to get them prior to OV today.  They were asked to call us at their earliest convenience with these once obtained. )  General: A & O * 3; sounds in no acute distress; in usual state of health.  Skin: Pt confirms warm and dry extremities and pink fingertips HEENT: Pt confirms lips non-cyanotic Chest: Patient confirms normal chest excursion and movement Respiratory: speaking in full sentences, no conversational dyspnea; patient confirms no use of accessory muscles Psych: insight appears good, mood- appears full

## 2019-01-20 ENCOUNTER — Other Ambulatory Visit: Payer: Self-pay | Admitting: Family Medicine

## 2019-01-20 DIAGNOSIS — E781 Pure hyperglyceridemia: Secondary | ICD-10-CM

## 2019-01-29 ENCOUNTER — Other Ambulatory Visit: Payer: Self-pay | Admitting: Family Medicine

## 2019-01-29 DIAGNOSIS — I159 Secondary hypertension, unspecified: Secondary | ICD-10-CM

## 2019-04-28 ENCOUNTER — Other Ambulatory Visit: Payer: Self-pay | Admitting: Family Medicine

## 2019-04-28 DIAGNOSIS — I159 Secondary hypertension, unspecified: Secondary | ICD-10-CM

## 2019-06-07 ENCOUNTER — Other Ambulatory Visit: Payer: Self-pay | Admitting: Family Medicine

## 2019-06-07 DIAGNOSIS — I159 Secondary hypertension, unspecified: Secondary | ICD-10-CM

## 2019-06-08 ENCOUNTER — Telehealth: Payer: Self-pay | Admitting: Family Medicine

## 2019-06-08 NOTE — Telephone Encounter (Signed)
Please contact patient to schedule follow up apt for future refills per Dr. Jenetta Downer last note. AS, CMA

## 2019-06-11 ENCOUNTER — Ambulatory Visit (INDEPENDENT_AMBULATORY_CARE_PROVIDER_SITE_OTHER): Payer: BC Managed Care – PPO | Admitting: Family Medicine

## 2019-06-11 ENCOUNTER — Encounter (INDEPENDENT_AMBULATORY_CARE_PROVIDER_SITE_OTHER): Payer: Self-pay | Admitting: Family Medicine

## 2019-06-11 ENCOUNTER — Other Ambulatory Visit: Payer: Self-pay

## 2019-06-11 VITALS — BP 130/77 | HR 86 | Temp 98.4°F | Ht 70.0 in | Wt 278.0 lb

## 2019-06-11 DIAGNOSIS — F3289 Other specified depressive episodes: Secondary | ICD-10-CM | POA: Diagnosis not present

## 2019-06-11 DIAGNOSIS — Z9189 Other specified personal risk factors, not elsewhere classified: Secondary | ICD-10-CM

## 2019-06-11 DIAGNOSIS — Z6839 Body mass index (BMI) 39.0-39.9, adult: Secondary | ICD-10-CM

## 2019-06-11 DIAGNOSIS — E8881 Metabolic syndrome: Secondary | ICD-10-CM | POA: Diagnosis not present

## 2019-06-11 DIAGNOSIS — R7303 Prediabetes: Secondary | ICD-10-CM

## 2019-06-11 MED ORDER — METFORMIN HCL 500 MG PO TABS: 500.0000 mg | ORAL_TABLET | Freq: Two times a day (BID) | ORAL | 0 refills | Status: DC

## 2019-06-11 MED ORDER — TOPIRAMATE 50 MG PO TABS: 50.0000 mg | ORAL_TABLET | Freq: Every day | ORAL | 0 refills | Status: DC

## 2019-06-13 NOTE — Progress Notes (Signed)
Office: [phone]  /  Fax: [phone]   HPI:   Chief Complaint: OBESITY Corey Martin is here to discuss his progress with his obesity treatment plan. He is on the keep a food journal with 500-700 calories and 50 grams of protein at supper daily and follow the Category 4 plan and is following his eating plan approximately 48 % of the time. He states he is doing intermittent walking and kayaking. Corey Martin last visit in the office was 8 months ago. He is under a lot of stress and has not been concentrating on his diet. He is frustrated with himself that he has gained weight and he wants to look at medication options to help.  His weight is 278 lb (126.1 kg) today and has gained 10 lbs since his last visit. He has lost 37 lbs since starting treatment with [place].  Pre-Diabetes Corey Martin has a diagnosis of pre-diabetes based on his elevated Hgb A1c and was informed this puts him at greater risk of developing diabetes. He is not on metformin anymore, he felt it helped and he wants to restart but at a high dose. He continues to work on diet and exercise to decrease risk of diabetes.   Depression with Emotional Eating Behaviors Corey Martin notes decrease in mood and increased stress and comfort eating. He requests something to help but he is already on other medications. Corey Martin struggles with emotional eating and using food for comfort to the extent that it is negatively impacting his health. He often snacks when he is not hungry. Corey Martin sometimes feels he is out of control and then feels guilty that he made poor food choices. He has been working on behavior modification techniques to help reduce his emotional eating and has been somewhat successful. He shows no sign of suicidal or homicidal ideations.  Depression screen Corey Martin 2/9 01/11/2019 03/30/2018 02/13/2018 12/29/2017 08/30/2017  Decreased Interest 0 1 0 0 0  Down, Depressed, Hopeless 1 0 0 0 1  PHQ - 2 Score 1 1 0 0 1  Altered sleeping 0 2 0 0 1  Tired, decreased energy 2 2 0 0 1   Change in appetite 0 1 0 0 1  Feeling bad or failure about yourself  0 0 0 0 0  Trouble concentrating 0 1 0 0 0  Moving slowly or fidgety/restless 0 0 0 0 0  Suicidal thoughts 0 0 0 0 0  PHQ-9 Score 3 7 0 0 4  Difficult doing work/chores Not difficult at all Not difficult at all Not difficult at all Not difficult at all Not difficult at all   At risk for cardiovascular disease Corey Martin is at a higher than average risk for cardiovascular disease due to obesity. He currently denies any chest pain.  ASSESSMENT AND PLAN:  Prediabetes - Plan: metFORMIN (GLUCOPHAGE) 500 MG tablet  Insulin resistance  Other depression - with emotional eating - Plan: topiramate (TOPAMAX) 50 MG tablet  At risk for heart disease  Class 2 severe obesity with serious comorbidity and body mass index (BMI) of 39.0 to 39.9 in adult, unspecified obesity type Corey Martin)  PLAN:  Pre-Diabetes Corey Martin will continue to work on weight loss, exercise, and decreasing simple carbohydrates in his diet to help decrease the risk of diabetes. We dicussed metformin including benefits and risks. He was informed that eating too many simple carbohydrates or too many calories at one sitting increases the likelihood of GI side effects. Corey Martin agrees to restart metformin and increase metformin to 500 mg PO  BID #60 with no refills. Corey Martin agrees to follow up with our clinic in 3 to 4 weeks as directed to monitor his progress.  Depression with Emotional Eating Behaviors We discussed behavior modification techniques today to help Corey Martin deal with his emotional eating and depression. Corey Martin agrees to start Topamax 50 mg PO qhs #30 with no refills. Corey Martin agrees to follow up with our clinic in 3 to 4 weeks.  Cardiovascular risk counseling Corey Martin was given extended (15 minutes) coronary artery disease prevention counseling today. He is 43 y.o. male and has risk factors for heart disease including obesity. We discussed intensive lifestyle modifications today with an  emphasis on specific weight loss instructions and strategies. Pt was also informed of the importance of increasing exercise and decreasing saturated fats to help prevent heart disease.  Obesity Corey Martin is currently in the action stage of change. As such, his goal is to continue with weight loss efforts He has agreed to keep a food journal with 500-700 calories and 50 grams of protein at supper daily and follow the Category 4 plan Corey Martin has been instructed to work up to a goal of 150 minutes of combined cardio and strengthening exercise per week for weight loss and overall health benefits. We discussed the following Behavioral Modification Strategies today: emotional eating strategies   Corey Martin has agreed to follow up with our clinic in 3 to 4 weeks. He was informed of the importance of frequent follow up visits to maximize his success with intensive lifestyle modifications for his multiple health conditions.  ALLERGIES: No Known Allergies  MEDICATIONS: Current Outpatient Medications on File Prior to Visit  Medication Sig Dispense Refill  . amLODipine (NORVASC) 5 MG tablet TAKE 2 TABLETS BY MOUTH EVERY DAY 180 tablet 0  . buPROPion (WELLBUTRIN XL) 300 MG 24 hr tablet Take 1 tablet (300 mg total) by mouth daily. Needs office visit 30 tablet 0  . Choline Fenofibrate (FENOFIBRIC ACID) 135 MG CPDR Take 1 tablet by mouth daily. 90 capsule 1  . LamoTRIgine (LAMICTAL PO) Take 100 mg by mouth daily.     Marland Kitchen lisinopril-hydrochlorothiazide (ZESTORETIC) 20-12.5 MG tablet Take 1 tablet by mouth daily. *PATIENT NEEDS OFFICE VISIT FOR FURTHER REFILLS* 30 tablet 0  . LORazepam (ATIVAN) 0.5 MG tablet Take 0.5 mg by mouth every 8 (eight) hours as needed for anxiety.    . Vitamin D, Ergocalciferol, (DRISDOL) 1.25 MG (50000 UT) CAPS capsule Take 1 capsule (50,000 Units total) by mouth every 3 (three) days. 12 capsule 3   No current facility-administered medications on file prior to visit.     PAST MEDICAL HISTORY:  Past Medical History:  Diagnosis Date  . Bipolar disorder (HCC)   . Constipation   . Depression    bipolar depression  . Hypercholesteremia   . Hypertension   . Joint pain   . Knee pain   . Sleep apnea     PAST SURGICAL HISTORY: Past Surgical History:  Procedure Laterality Date  . ADENOIDECTOMY    . KNEE SURGERY      SOCIAL HISTORY: Social History   Tobacco Use  . Smoking status: Never Smoker  . Smokeless tobacco: Never Used  Substance Use Topics  . Alcohol use: Yes    Alcohol/week: 3.0 standard drinks    Types: 1 Glasses of wine, 1 Cans of beer, 1 Shots of liquor per week  . Drug use: No    FAMILY HISTORY: Family History  Problem Relation Age of Onset  . Hyperlipidemia Mother   .  Hypertension Mother   . Depression Mother   . Anxiety disorder Mother   . Obesity Mother   . Heart attack Father   . Hyperlipidemia Father   . Hypertension Father   . Obesity Father   . Sleep apnea Father   . Diabetes Maternal Grandmother     ROS: Review of Systems  Constitutional: Negative for weight loss.  Cardiovascular: Negative for chest pain.  Psychiatric/Behavioral: Positive for depression. Negative for suicidal ideas.    PHYSICAL EXAM: Blood pressure 130/77, pulse 86, temperature 98.4 F (36.9 C), temperature source Oral, height 5\' 10"  (1.778 m), weight 278 lb (126.1 kg), SpO2 96 %. Body mass index is 39.89 kg/m. Physical Exam Vitals signs reviewed.  Constitutional:      Appearance: Normal appearance. He is obese.  Cardiovascular:     Rate and Rhythm: Normal rate.     Pulses: Normal pulses.  Pulmonary:     Effort: Pulmonary effort is normal.     Breath sounds: Normal breath sounds.  Musculoskeletal: Normal range of motion.  Skin:    General: Skin is warm and dry.  Neurological:     Mental Status: He is alert and oriented to person, place, and time.  Psychiatric:        Mood and Affect: Mood normal.        Behavior: Behavior normal.     RECENT LABS  AND TESTS: BMET    Component Value Date/Time   NA 139 03/30/2018 0913   K 4.3 03/30/2018 0913   CL 102 03/30/2018 0913   CO2 21 03/30/2018 0913   GLUCOSE 89 03/30/2018 0913   BUN 12 03/30/2018 0913   CREATININE 0.98 03/30/2018 0913   CALCIUM 10.0 03/30/2018 0913   GFRNONAA 96 03/30/2018 0913   GFRAA 111 03/30/2018 0913   Lab Results  Component Value Date   HGBA1C 5.3 03/30/2018   HGBA1C 5.3 02/13/2018   HGBA1C 5.2 08/02/2016   Lab Results  Component Value Date   INSULIN 18.1 03/30/2018   CBC    Component Value Date/Time   WBC 6.7 03/30/2018 0913   RBC 4.88 03/30/2018 0913   HGB 14.7 03/30/2018 0913   HCT 44.0 03/30/2018 0913   PLT 332 02/13/2018 0849   MCV 90 03/30/2018 0913   MCH 30.1 03/30/2018 0913   MCHC 33.4 03/30/2018 0913   RDW 13.1 03/30/2018 0913   LYMPHSABS 2.0 03/30/2018 0913   EOSABS 0.3 03/30/2018 0913   BASOSABS 0.0 03/30/2018 0913   Iron/TIBC/Ferritin/ %Sat No results found for: IRON, TIBC, FERRITIN, IRONPCTSAT Lipid Panel     Component Value Date/Time   CHOL 204 (H) 03/30/2018 0913   TRIG 153 (H) 03/30/2018 0913   HDL 48 03/30/2018 0913   CHOLHDL 3.9 02/13/2018 0849   LDLCALC 125 (H) 03/30/2018 0913   Hepatic Function Panel     Component Value Date/Time   PROT 7.3 03/30/2018 0913   ALBUMIN 4.9 03/30/2018 0913   AST 23 03/30/2018 0913   ALT 36 03/30/2018 0913   ALKPHOS 35 (L) 03/30/2018 0913   BILITOT 0.4 03/30/2018 0913      Component Value Date/Time   TSH 2.700 03/30/2018 0913   TSH 3.450 02/13/2018 0849   TSH 3.230 01/11/2017 0920      OBESITY BEHAVIORAL INTERVENTION VISIT  Today's visit was # 7   Starting weight: 315 lbs Starting date: 03/30/18 Today's weight : 278 lbs  Today's date: 06/11/2019 Total lbs lost to date: 37    ASK: We discussed the  diagnosis of obesity with Corey Martin today and Corey Martin agreed to give us permission to discuss obesity behavioral modification therapy today.  ASSESS: Corey Martin has the  diagnosis of obesity and his BMI today is 1539.89 Corey Martin is in the action stage of change   ADVISE: Corey Martin was educated on the multiple health risks of obesity as well as the benefit of weight loss to improve his health. He was advised of the need for long term treatment and the importance of lifestyle modifications to improve his current health and to decrease his risk of future health problems.  AGREE: Multiple dietary modification options and treatment options were discussed and  Corey Martin agreed to follow the recommendations documented in the above note.  ARRANGE: Corey Martin was educated on the importance of frequent visits to treat obesity as outlined per CMS and USPSTF guidelines and agreed to schedule his next follow up appointment today.  I, Burt KnackSharon Martin, am acting as transcriptionist for Quillian Quincearen Stephon Weathers, MD  I have reviewed the above documentation for accuracy and completeness, and I agree with the above. -Quillian Quincearen Jermane Brayboy, MD

## 2019-06-14 ENCOUNTER — Telehealth: Payer: Self-pay | Admitting: Family Medicine

## 2019-06-14 NOTE — Telephone Encounter (Signed)
Called pt to set up Appt ---Rx refills pnding for TELEHEALTH --lEFT MSG FOR PT TO CALL OFFICE.  --[facility]

## 2019-07-10 ENCOUNTER — Other Ambulatory Visit (INDEPENDENT_AMBULATORY_CARE_PROVIDER_SITE_OTHER): Payer: Self-pay | Admitting: Family Medicine

## 2019-07-10 ENCOUNTER — Other Ambulatory Visit: Payer: Self-pay | Admitting: Family Medicine

## 2019-07-10 DIAGNOSIS — R7303 Prediabetes: Secondary | ICD-10-CM

## 2019-07-10 DIAGNOSIS — I159 Secondary hypertension, unspecified: Secondary | ICD-10-CM

## 2019-07-10 DIAGNOSIS — F3289 Other specified depressive episodes: Secondary | ICD-10-CM

## 2019-07-17 ENCOUNTER — Other Ambulatory Visit: Payer: Self-pay

## 2019-07-17 ENCOUNTER — Ambulatory Visit (INDEPENDENT_AMBULATORY_CARE_PROVIDER_SITE_OTHER): Payer: BC Managed Care – PPO | Admitting: Family Medicine

## 2019-07-17 ENCOUNTER — Encounter (INDEPENDENT_AMBULATORY_CARE_PROVIDER_SITE_OTHER): Payer: Self-pay | Admitting: Family Medicine

## 2019-07-17 VITALS — BP 129/79 | HR 73 | Temp 98.3°F | Ht 70.0 in | Wt 272.0 lb

## 2019-07-17 DIAGNOSIS — R7303 Prediabetes: Secondary | ICD-10-CM | POA: Diagnosis not present

## 2019-07-17 DIAGNOSIS — Z9189 Other specified personal risk factors, not elsewhere classified: Secondary | ICD-10-CM | POA: Diagnosis not present

## 2019-07-17 DIAGNOSIS — F3289 Other specified depressive episodes: Secondary | ICD-10-CM | POA: Diagnosis not present

## 2019-07-17 DIAGNOSIS — Z6839 Body mass index (BMI) 39.0-39.9, adult: Secondary | ICD-10-CM

## 2019-07-17 MED ORDER — METFORMIN HCL 500 MG PO TABS: 500.0000 mg | ORAL_TABLET | Freq: Two times a day (BID) | ORAL | 1 refills | Status: DC

## 2019-07-17 NOTE — Progress Notes (Signed)
Office: [phone]  /  Fax: [phone]   HPI:  Chief Complaint: OBESITY Corey Martin is here to discuss his progress with his obesity treatment plan. He is on the Category 4 plan and states he is following his eating plan approximately 70% of the time. He states he is exercising 0 minutes 0 times per week.  Corey Martin is doing well with diet and weight loss. He notes decreased p.m. snacking and is working on meal planning. He doesn't feel the holidays will get him too off track with his eating.  Today's visit was #8 Starting weight: 315 lbs Starting date: 03/30/2018 Today's weight: 272 lbs  Today's date: 07/17/2019 Total lbs lost to date: 43 Total lbs lost since last in-office visit: 6   Insulin Resistance Corey Martin notes decreased polyphagia lately while following his plan and taking metformin regularly.  At risk for diabetes Corey Martin is at higher than average risk for developing diabetes due to his obesity.   Emotional Eating Corey Martin started topiramate and had significant diarrhea so, of course, he stopped taking it. He has done well avoiding p.m. snacking.  ASSESSMENT AND PLAN:  Prediabetes - Plan: metFORMIN (GLUCOPHAGE) 500 MG tablet  Other depression, with emotional eating  At risk for diabetes mellitus  Class 2 severe obesity with serious comorbidity and body mass index (BMI) of 39.0 to 39.9 in adult, unspecified obesity type (HCC)  PLAN:  Insulin Resistance Corey Martin will continue to work on weight loss, exercise, and decreasing simple carbohydrates to help decrease the risk of diabetes. Corey Martin was given a refill on his metformin x2 month supply and he agrees to follow-up with our clinic in 6 weeks. He will have labs rechecked at his next visit.  Diabetes risk counseling (~15 min) Corey Martin is a 43 y.o. male and has risk factors for diabetes including obesity. We discussed intensive lifestyle modifications today with an emphasis on weight loss as well as increasing exercise and decreasing simple  carbohydrates in his diet.  Emotional Eating Corey Martin was advised it was okay to discontinue Topamax. We will continue to monitor.  Obesity Corey Martin is currently in the action stage of change. As such, his goal is to continue with weight loss efforts. He has agreed to follow the Category 4 plan and journal 500-700 calories + 50 grams of protein at supper. Corey Martin has been instructed to work up to a goal of 150 minutes of combined cardio and strengthening exercise per week for weight loss and overall health benefits. We discussed the following Behavioral Modification Strategies today: increasing lean protein intake and holiday eating strategies.  Corey Martin has agreed to follow-up with our clinic in 6 weeks. He was informed of the importance of frequent follow-up visits to maximize his success with intensive lifestyle modifications for his multiple health conditions.  ALLERGIES: No Known Allergies  MEDICATIONS: Current Outpatient Medications on File Prior to Visit  Medication Sig Dispense Refill  . amLODipine (NORVASC) 5 MG tablet TAKE 2 TABLETS BY MOUTH EVERY DAY 180 tablet 0  . buPROPion (WELLBUTRIN XL) 300 MG 24 hr tablet Take 1 tablet (300 mg total) by mouth daily. Needs office visit 30 tablet 0  . Choline Fenofibrate (FENOFIBRIC ACID) 135 MG CPDR Take 1 tablet by mouth daily. 90 capsule 1  . LamoTRIgine (LAMICTAL PO) Take 100 mg by mouth daily.     Marland Kitchen lisinopril-hydrochlorothiazide (ZESTORETIC) 20-12.5 MG tablet TAKE 1 TABLET BY MOUTH DAILY. *PATIENT NEEDS OFFICE VISIT FOR FURTHER REFILLS* 15 tablet 0  . LORazepam (ATIVAN) 0.5 MG tablet Take  0.5 mg by mouth every 8 (eight) hours as needed for anxiety.    . Vitamin D, Ergocalciferol, (DRISDOL) 1.25 MG (50000 UT) CAPS capsule Take 1 capsule (50,000 Units total) by mouth every 3 (three) days. 12 capsule 3   No current facility-administered medications on file prior to visit.    PAST MEDICAL HISTORY: Past Medical History:  Diagnosis Date  . Bipolar  disorder ([place])   . Constipation   . Depression    bipolar depression  . Hypercholesteremia   . Hypertension   . Joint pain   . Knee pain   . Sleep apnea     PAST SURGICAL HISTORY: Past Surgical History:  Procedure Laterality Date  . ADENOIDECTOMY    . KNEE SURGERY      SOCIAL HISTORY: Social History   Tobacco Use  . Smoking status: Never Smoker  . Smokeless tobacco: Never Used  Substance Use Topics  . Alcohol use: Yes    Alcohol/week: 3.0 standard drinks    Types: 1 Glasses of wine, 1 Cans of beer, 1 Shots of liquor per week  . Drug use: No    FAMILY HISTORY: Family History  Problem Relation Age of Onset  . Hyperlipidemia Mother   . Hypertension Mother   . Depression Mother   . Anxiety disorder Mother   . Obesity Mother   . Heart attack Father   . Hyperlipidemia Father   . Hypertension Father   . Obesity Father   . Sleep apnea Father   . Diabetes Maternal Grandmother    ROS: Review of Systems  Constitutional: Positive for weight loss.  Endo/Heme/Allergies:       Positive for decreased polyphagia.   PHYSICAL EXAM: Blood pressure 129/79, pulse 73, temperature 98.3 F (36.8 C), temperature source Oral, height 5\' 10"  (1.778 m), weight 272 lb (123.4 kg), SpO2 97 %. Body mass index is 39.03 kg/m. Physical Exam Vitals reviewed.  Constitutional:      Appearance: Normal appearance. He is obese.  Cardiovascular:     Rate and Rhythm: Normal rate.     Pulses: Normal pulses.  Pulmonary:     Effort: Pulmonary effort is normal.     Breath sounds: Normal breath sounds.  Musculoskeletal:        General: Normal range of motion.  Skin:    General: Skin is warm and dry.  Neurological:     Mental Status: He is alert and oriented to person, place, and time.  Psychiatric:        Behavior: Behavior normal.   RECENT LABS AND TESTS: BMET    Component Value Date/Time   NA 139 03/30/2018 0913   K 4.3 03/30/2018 0913   CL 102 03/30/2018 0913   CO2 21 03/30/2018  0913   GLUCOSE 89 03/30/2018 0913   BUN 12 03/30/2018 0913   CREATININE 0.98 03/30/2018 0913   CALCIUM 10.0 03/30/2018 0913   GFRNONAA 96 03/30/2018 0913   GFRAA 111 03/30/2018 0913   Lab Results  Component Value Date   HGBA1C 5.3 03/30/2018   HGBA1C 5.3 02/13/2018   HGBA1C 5.2 08/02/2016   Lab Results  Component Value Date   INSULIN 18.1 03/30/2018   CBC    Component Value Date/Time   WBC 6.7 03/30/2018 0913   RBC 4.88 03/30/2018 0913   HGB 14.7 03/30/2018 0913   HCT 44.0 03/30/2018 0913   PLT 332 02/13/2018 0849   MCV 90 03/30/2018 0913   MCH 30.1 03/30/2018 0913   MCHC 33.4 03/30/2018  0913   RDW 13.1 03/30/2018 0913   LYMPHSABS 2.0 03/30/2018 0913   EOSABS 0.3 03/30/2018 0913   BASOSABS 0.0 03/30/2018 0913   Iron/TIBC/Ferritin/ %Sat No results found for: IRON, TIBC, FERRITIN, IRONPCTSAT Lipid Panel     Component Value Date/Time   CHOL 204 (H) 03/30/2018 0913   TRIG 153 (H) 03/30/2018 0913   HDL 48 03/30/2018 0913   CHOLHDL 3.9 02/13/2018 0849   LDLCALC 125 (H) 03/30/2018 0913   Hepatic Function Panel     Component Value Date/Time   PROT 7.3 03/30/2018 0913   ALBUMIN 4.9 03/30/2018 0913   AST 23 03/30/2018 0913   ALT 36 03/30/2018 0913   ALKPHOS 35 (L) 03/30/2018 0913   BILITOT 0.4 03/30/2018 0913      Component Value Date/Time   TSH 2.700 03/30/2018 0913   TSH 3.450 02/13/2018 0849   TSH 3.230 01/11/2017 0920      I, Marianna Paymentenise Haag, am acting as Energy managertranscriptionist for Quillian Quincearen Marlowe Cinquemani, MD I have reviewed the above documentation for accuracy and completeness, and I agree with the above. -Quillian Quincearen Ramona Slinger, MD

## 2019-07-25 ENCOUNTER — Other Ambulatory Visit: Payer: Self-pay | Admitting: Family Medicine

## 2019-07-25 DIAGNOSIS — I159 Secondary hypertension, unspecified: Secondary | ICD-10-CM

## 2019-08-02 ENCOUNTER — Other Ambulatory Visit (INDEPENDENT_AMBULATORY_CARE_PROVIDER_SITE_OTHER): Payer: Self-pay | Admitting: Family Medicine

## 2019-08-02 DIAGNOSIS — R7303 Prediabetes: Secondary | ICD-10-CM

## 2019-08-28 ENCOUNTER — Ambulatory Visit (INDEPENDENT_AMBULATORY_CARE_PROVIDER_SITE_OTHER): Payer: BC Managed Care – PPO | Admitting: Family Medicine

## 2019-08-29 ENCOUNTER — Telehealth: Payer: Self-pay | Admitting: Family Medicine

## 2019-08-29 NOTE — Telephone Encounter (Signed)
Patient called states know Rx refills tied to OV/ Appts  (scheduled Labs & OV w/provider).  --Pt wonders if BP can be taken @ Lab appt on tomorrow 2/4 & can it be used for OV scheduled for 2/10---- if so then APPT can be switched to a Doxy/Virtual visit over the phone---    Please advise  --glh

## 2019-08-30 ENCOUNTER — Other Ambulatory Visit: Payer: Self-pay | Admitting: Family Medicine

## 2019-08-30 ENCOUNTER — Other Ambulatory Visit: Payer: BC Managed Care – PPO

## 2019-08-30 ENCOUNTER — Other Ambulatory Visit: Payer: Self-pay

## 2019-08-30 DIAGNOSIS — E781 Pure hyperglyceridemia: Secondary | ICD-10-CM

## 2019-08-30 DIAGNOSIS — I159 Secondary hypertension, unspecified: Secondary | ICD-10-CM

## 2019-08-30 DIAGNOSIS — E782 Mixed hyperlipidemia: Secondary | ICD-10-CM | POA: Diagnosis not present

## 2019-08-30 DIAGNOSIS — E538 Deficiency of other specified B group vitamins: Secondary | ICD-10-CM | POA: Diagnosis not present

## 2019-08-30 DIAGNOSIS — E8881 Metabolic syndrome: Secondary | ICD-10-CM | POA: Diagnosis not present

## 2019-08-30 NOTE — Addendum Note (Signed)
Addended by: Stan Head on: 08/30/2019 10:00 AM   Modules accepted: Orders

## 2019-08-31 LAB — LIPID PANEL
Chol/HDL Ratio: 3.3 ratio (ref 0.0–5.0)
Cholesterol, Total: 181 mg/dL (ref 100–199)
HDL: 55 mg/dL (ref >39)
LDL Chol Calc (NIH): 114 mg/dL — ABNORMAL HIGH (ref 0–99)
Triglycerides: 61 mg/dL (ref 0–149)
VLDL Cholesterol Cal: 12 mg/dL (ref 5–40)

## 2019-08-31 LAB — CBC WITH DIFFERENTIAL/PLATELET
Basophils Absolute: 0 10*3/uL (ref 0.0–0.2)
Basos: 1 %
EOS (ABSOLUTE): 0.1 10*3/uL (ref 0.0–0.4)
Eos: 3 %
Hematocrit: 43.1 % (ref 37.5–51.0)
Hemoglobin: 14.8 g/dL (ref 13.0–17.7)
Immature Grans (Abs): 0 10*3/uL (ref 0.0–0.1)
Immature Granulocytes: 0 %
Lymphocytes Absolute: 2.1 10*3/uL (ref 0.7–3.1)
Lymphs: 39 %
MCH: 30.8 pg (ref 26.6–33.0)
MCHC: 34.3 g/dL (ref 31.5–35.7)
MCV: 90 fL (ref 79–97)
Monocytes Absolute: 0.6 10*3/uL (ref 0.1–0.9)
Monocytes: 11 %
Neutrophils Absolute: 2.5 10*3/uL (ref 1.4–7.0)
Neutrophils: 46 %
Platelets: 338 10*3/uL (ref 150–450)
RBC: 4.81 x10E6/uL (ref 4.14–5.80)
RDW: 12.2 % (ref 11.6–15.4)
WBC: 5.3 10*3/uL (ref 3.4–10.8)

## 2019-08-31 LAB — VITAMIN B12: Vitamin B-12: 202 pg/mL — ABNORMAL LOW (ref 232–1245)

## 2019-08-31 LAB — INSULIN, RANDOM: INSULIN: 11.7 u[IU]/mL (ref 2.6–24.9)

## 2019-09-05 ENCOUNTER — Other Ambulatory Visit: Payer: Self-pay

## 2019-09-05 ENCOUNTER — Ambulatory Visit (INDEPENDENT_AMBULATORY_CARE_PROVIDER_SITE_OTHER): Payer: BC Managed Care – PPO | Admitting: Family Medicine

## 2019-09-05 ENCOUNTER — Encounter: Payer: Self-pay | Admitting: Family Medicine

## 2019-09-05 VITALS — BP 127/85 | HR 61 | Ht 70.0 in | Wt 283.0 lb

## 2019-09-05 DIAGNOSIS — E781 Pure hyperglyceridemia: Secondary | ICD-10-CM

## 2019-09-05 DIAGNOSIS — E538 Deficiency of other specified B group vitamins: Secondary | ICD-10-CM

## 2019-09-05 DIAGNOSIS — E8881 Metabolic syndrome: Secondary | ICD-10-CM

## 2019-09-05 DIAGNOSIS — F4323 Adjustment disorder with mixed anxiety and depressed mood: Secondary | ICD-10-CM

## 2019-09-05 DIAGNOSIS — I159 Secondary hypertension, unspecified: Secondary | ICD-10-CM | POA: Diagnosis not present

## 2019-09-05 DIAGNOSIS — E559 Vitamin D deficiency, unspecified: Secondary | ICD-10-CM

## 2019-09-05 DIAGNOSIS — F39 Unspecified mood [affective] disorder: Secondary | ICD-10-CM

## 2019-09-05 DIAGNOSIS — E782 Mixed hyperlipidemia: Secondary | ICD-10-CM

## 2019-09-05 MED ORDER — METFORMIN HCL 500 MG PO TABS: 500.0000 mg | ORAL_TABLET | Freq: Two times a day (BID) | ORAL | 0 refills | Status: DC

## 2019-09-05 MED ORDER — VITAMIN D (ERGOCALCIFEROL) 1.25 MG (50000 UNIT) PO CAPS: 50000.0000 [IU] | ORAL_CAPSULE | ORAL | 1 refills | Status: AC

## 2019-09-05 MED ORDER — VITAMIN B-12 1000 MCG PO TABS: 1000.0000 ug | ORAL_TABLET | Freq: Every day | ORAL | Status: AC

## 2019-09-05 NOTE — Progress Notes (Signed)
Telehealth office visit note for Corey Martin, D.O- at [facility] at [facility]   I connected with current patient today and verified that I am speaking with the correct person using two identifiers.   . Location of the patient: Home . Location of the provider: Office Only the patient (+/- their family members at pt's discretion) and myself were participating in the encounter    - This visit type was conducted due to national recommendations for restrictions regarding the COVID-19 Pandemic (e.g. social distancing) in an effort to limit this patient's exposure and mitigate transmission in our community.  This format is felt to be most appropriate for this patient at this time.   - The patient did not have access to video technology or had technical difficulties with video requiring transitioning to audio format only. - No physical exam could be performed with this format, beyond that communicated to [place] by the patient/ family members as noted.   - Additionally my office staff/ schedulers discussed with the patient that there may be a monetary charge related to this service, depending on their medical insurance.   The patient expressed understanding, and agreed to proceed.   Impression and Recommendations:    1. Secondary hypertension   2. Mood disorder ([name])   3. Mixed hyperlipidemia   4. Hypertriglyceridemia   5. Obesity, Class III, BMI 40-49.9 (morbid obesity) ([place])   6. Insulin resistance   7. B12 deficiency   8. Vitamin D deficiency   9. Mood D/o-  sees psychiatry for management     Secondary hypertension -Patient blood pressure well controlled and just his lisinopril hydrochlorothiazide.  He has been off his amlodipine for 3 weeks and wishes to discontinue it. -This was taken off his med list today and patient was highly encouraged to keep a very close eye on his blood pressures at home and follow-up sooner than planned if remains above goal of around 130/80 or less  -Encouraged to continue weight loss as this will continue to improve his blood pressures.  Mood disorder ([name]) -Patient is treated by his psychiatry team.  The mood treatment center with Dr. Mardene Sayer group is who he sees at [place]. -Patient will continue with medication and management per them. -We did discuss counseling, exercise, sleep, prudent diet and prayer or meditation to help with mood as well   Mixed hyperlipidemia -In terms of cholesterol, patient continues on fenofibrate.  Still suboptimally controlled but patient will continue weight loss and hopefully we will continue to see a decrease in his LDL.    Hypertriglyceridemia - Vast improvement since patient has lost weight.   Obesity, Class III, BMI 40-49.9 (morbid obesity) ([name]) -Encourage patient to continue on the plan and follow-up with obesity medicine. -Patient states that it is very been very successful for him and he wishes to continue however finances are an issue.   Insulin resistance  - Plan: metFORMIN (GLUCOPHAGE) 500 MG tablet -Patient's insulin level fasting has decreased from prior with his recent weight loss.  Continue weight loss. -Reviewed all labs with patient today.   B12 deficiency  - Plan: vitamin B-12 (CYANOCOBALAMIN) 1000 MCG tablet -patient will get this over-the-counter    Vitamin D deficiency  - Plan: Vitamin D, Ergocalciferol, (DRISDOL) 1.25 MG (50000 UNIT) CAPS capsule -Patient to emergency been on only 1 tablet every 7 days.  Prior it was reported patient was taking 1 tablet every 3 days hence corrected in the chart today.   Mood D/o -  sees psychiatry for management - Mood stable at current.    Meds ordered this encounter  Medications  . metFORMIN (GLUCOPHAGE) 500 MG tablet    Sig: Take 1 tablet (500 mg total) by mouth 2 (two) times daily with a meal. No RF until OV    Dispense:  180 tablet    Refill:  0  . Vitamin D, Ergocalciferol, (DRISDOL) 1.25 MG (50000 UNIT) CAPS capsule     Sig: Take 1 capsule (50,000 Units total) by mouth every 7 (seven) days.    Dispense:  12 capsule    Refill:  1  . vitamin B-12 (CYANOCOBALAMIN) 1000 MCG tablet    Sig: Take 1 tablet (1,000 mcg total) by mouth daily.    Dispense:       Medications Discontinued During This Encounter  Medication Reason  . amLODipine (NORVASC) 5 MG tablet   . Vitamin D, Ergocalciferol, (DRISDOL) 1.25 MG (50000 UT) CAPS capsule   . metFORMIN (GLUCOPHAGE) 500 MG tablet Reorder     Gross side effects, risk and benefits, and alternatives of medications and treatment plan in general discussed with patient.  Patient is aware that all medications have potential side effects and we are unable to predict every side effect or drug-drug interaction that may occur.   Patient will call with any questions prior to using medication if they have concerns.    Expresses verbal understanding and consents to current therapy and treatment regimen.  No barriers to understanding were identified.  Red flag symptoms and signs discussed in detail.  Patient expressed understanding regarding what to do in case of emergency\urgent symptoms  Please see AVS handed out to patient at the end of our visit for further patient instructions/ counseling done pertaining to today's office visit.   Return Patient will continue to follow-up with healthy weight and wellness as well as psychiatry,  ---> Will f/up here in 4 to 6 months for yearly physical with full set of fasting blood work at that time; sooner if issues or concerns w Bp etc     Note:  This note was prepared with assistance of Dragon voice recognition software. Occasional wrong-word or sound-a-like substitutions may have occurred due to the inherent limitations of voice recognition software.  Patient was interviewed today for his 25+ minutes.      --------------------------------------------------------------------------------------------------------------------------------------------------------------------------------------------------------------------------------------------    Subjective:     HPI: Corey Martin is a 44 y.o. male who presents to [place] [facility] today for issues as discussed below.   HPI:   Hyperlipidemia:  44 y.o. male is here for hyperlipidemia follow-up.  - Patient reports good compliance with treatment plan of:  medication and/ or lifestyle management.   - Patient denies any acute concerns or problems with management plan  - He denies new onset of: myalgias, arthralgias, increased fatigue more than normal, DIB, chest pains or SOB  Most recent cholesterol panel was:  Lab Results  Component Value Date   CHOL 181 08/30/2019   HDL 55 08/30/2019   LDLCALC 114 (H) 08/30/2019   TRIG 61 08/30/2019   CHOLHDL 3.3 08/30/2019   Hepatic Function Latest Ref Rng & Units 03/30/2018 02/13/2018 01/11/2017  Total Protein 6.0 - 8.5 g/dL 7.3 6.8 6.5  Albumin 3.5 - 5.5 g/dL 4.9 4.7 4.5  AST 0 - 40 IU/L '23 21 13  ' ALT 0 - 44 IU/L 36 36 26  Alk Phosphatase 39 - 117 IU/L 35(L) 36(L) 34(L)  Total Bilirubin 0.0 - 1.2  mg/dL 0.4 0.3 0.2     Obesity:  Pt has been following with the wt loss clinic- Dr Leafy Ro regularly.   Having insurance issues now and not sure he can financially swing it.  Has not been in some time.   Started at 316lbs - lowest was 255 on 01/11/19, but now is back up.  Insulin resistance and craving control:  - has been on metformin some time now for craving control and denies any side effects.  He was prior to getting it from the weight loss clinic which she is no longer going to right now.  His fasting insulin when starting there was initially was 18.1 and most recently on 08/30/2019 it was down to 11.7 along with his weight loss and the Metformin use.  Patient desires refill    HPI:  Hypertension: -  His blood pressure at home has been running: 120's/70's.   - Has been off amlodipine 3wks now or so--> says  - Patient reports good compliance with medication and/or lifestyle modification  - His denies acute concerns or problems related to treatment plan  - He denies new onset of: chest pain, exercise intolerance, shortness of breath, dizziness, visual changes, headache, lower extremity swelling or claudication.   Today their BP is BP: 127/85(Taken 08/30/19)  Last 3 blood pressure readings in our office are as follows: BP Readings from Last 3 Encounters:  09/05/19 127/85  07/17/19 129/79  06/11/19 130/77   Filed Weights   09/05/19 0949  Weight: 283 lb (128.4 kg)      Wt Readings from Last 3 Encounters:  09/05/19 283 lb (128.4 kg)  07/17/19 272 lb (123.4 kg)  06/11/19 278 lb (126.1 kg)   BP Readings from Last 3 Encounters:  09/05/19 127/85  07/17/19 129/79  06/11/19 130/77   Pulse Readings from Last 3 Encounters:  09/05/19 61  07/17/19 73  06/11/19 86   BMI Readings from Last 3 Encounters:  09/05/19 40.61 kg/m  07/17/19 39.03 kg/m  06/11/19 39.89 kg/m     Patient Care Team    Relationship Specialty Notifications Start End  Corey Dance, DO PCP - General Family Medicine  07/29/16   Eliseo Gum, MD  Psychiatry  01/13/17    Comment: seeing a doc there- not sure of name     Patient Active Problem List   Diagnosis Date Noted  . HLD (hyperlipidemia) 01/11/2019  . Mood disorder ([place]) 01/13/2017  . HTN (hypertension) 07/29/2016  . Obesity, Class III, BMI 40-49.9 (morbid obesity) ([place]) 07/29/2016  . Hypertriglyceridemia 07/29/2016  . Generalized OA- knee 07/29/2016  . Vitamin D deficiency 01/04/2017  . History of meniscal tear- L knee 07/29/2016  . B12 deficiency 01/11/2019  . Mood D/o  01/11/2019  . Insulin resistance 01/11/2019  . Sleep apnea   . Secondary hypertension 12/29/2017  . Elevated LDL cholesterol level  01/13/2017  . Influenza A 09/03/2016    Past Medical history, Surgical history, Family history, Social history, Allergies and Medications have been entered into the medical record, reviewed and changed as needed.    Current Meds  Medication Sig  . buPROPion (WELLBUTRIN XL) 300 MG 24 hr tablet Take 1 tablet (300 mg total) by mouth daily. Needs office visit  . Choline Fenofibrate (FENOFIBRIC ACID) 135 MG CPDR TAKE 1 CAPSULE BY MOUTH EVERY DAY  . LamoTRIgine (LAMICTAL PO) Take 100 mg by mouth daily.   Marland Kitchen lisinopril-hydrochlorothiazide (ZESTORETIC) 20-12.5 MG tablet TAKE 1 TABLET BY MOUTH DAILY. *PATIENT NEEDS OFFICE VISIT FOR  FURTHER REFILLS*  . LORazepam (ATIVAN) 0.5 MG tablet Take 0.5 mg by mouth every 8 (eight) hours as needed for anxiety.  . metFORMIN (GLUCOPHAGE) 500 MG tablet Take 1 tablet (500 mg total) by mouth 2 (two) times daily with a meal. No RF until OV  . Vitamin D, Ergocalciferol, (DRISDOL) 1.25 MG (50000 UNIT) CAPS capsule Take 1 capsule (50,000 Units total) by mouth every 7 (seven) days.  . [DISCONTINUED] metFORMIN (GLUCOPHAGE) 500 MG tablet Take 1 tablet (500 mg total) by mouth 2 (two) times daily with a meal. (Patient taking differently: Take 500 mg by mouth 2 (two) times daily with a meal. Patient states he is only taking 554m QD for Metformin)  . [DISCONTINUED] Vitamin D, Ergocalciferol, (DRISDOL) 1.25 MG (50000 UT) CAPS capsule Take 1 capsule (50,000 Units total) by mouth every 3 (three) days. (Patient taking differently: Take 50,000 Units by mouth every 7 (seven) days. )    Allergies:  No Known Allergies   Review of Systems:  A fourteen system review of systems was performed and found to be positive as per HPI.   Objective:   Blood pressure 127/85, pulse 61, height '5\' 10"'  (1.778 m), weight 283 lb (128.4 kg). Body mass index is 40.61 kg/m. General:  Well Developed, well nourished, appropriate for stated age.  Neuro:  Alert and oriented,  extra-ocular muscles intact   HEENT:  Normocephalic, atraumatic, neck supple, no carotid bruits appreciated  Skin:  no gross rash, warm, pink. Cardiac:  RRR, S1 S2 Respiratory:  ECTA B/L and A/P, Not using accessory muscles, speaking in full sentences- unlabored. Vascular:  Ext warm, no cyanosis apprec.; cap RF less 2 sec. Psych:  No HI/SI, judgement and insight good, Euthymic mood. Full Affect.

## 2019-10-12 DIAGNOSIS — F3181 Bipolar II disorder: Secondary | ICD-10-CM | POA: Diagnosis not present

## 2019-10-12 DIAGNOSIS — F902 Attention-deficit hyperactivity disorder, combined type: Secondary | ICD-10-CM | POA: Diagnosis not present

## 2019-11-05 ENCOUNTER — Other Ambulatory Visit: Payer: Self-pay | Admitting: Family Medicine

## 2019-11-05 DIAGNOSIS — I159 Secondary hypertension, unspecified: Secondary | ICD-10-CM

## 2019-11-05 NOTE — Telephone Encounter (Signed)
Patient called says pharmacy will not fill his Lisinoprl Rx (says he need OV, but he just had one in Feb 2021)  AVS shows :  Return Patient will continue to follow-up with healthy weight and wellness as well as psychiatry,  ---> Will f/up here in 4 to 6 months for yearly physical with full set of fasting blood work at that time; sooner if issues or concerns w Bp etc    --Forwarding refill request for 3 month supply of :   lisinopril-hydrochlorothiazide (ZESTORETIC) 20-12.5 MG tablet [[id number]] DISCONTINUED  Order Details Dose: 1 tablet Route: Oral Frequency: Daily  Dispense Quantity: 90 tablet Refills: 1       Sig: Take 1 tablet by mouth daily. (needs OV for RF)       --Pt uses :  [id number] [place], [place] - [address]  [address], [place] [address]  Phone:  [phone] Fax:  [phone]   --glh

## 2019-11-06 MED ORDER — LISINOPRIL-HYDROCHLOROTHIAZIDE 20-12.5 MG PO TABS: 1.0000 | ORAL_TABLET | Freq: Every day | ORAL | 0 refills | Status: DC

## 2019-11-28 ENCOUNTER — Telehealth: Payer: Self-pay

## 2019-11-28 ENCOUNTER — Other Ambulatory Visit: Payer: Self-pay | Admitting: Family Medicine

## 2019-11-28 DIAGNOSIS — E8881 Metabolic syndrome: Secondary | ICD-10-CM

## 2019-11-28 NOTE — Telephone Encounter (Signed)
Please call pt to schedule appt.  No further refills until pt is seen.  T. Rudi Bunyard, CMA  

## 2019-11-30 ENCOUNTER — Ambulatory Visit (INDEPENDENT_AMBULATORY_CARE_PROVIDER_SITE_OTHER): Payer: BC Managed Care – PPO | Admitting: Addiction (Substance Use Disorder)

## 2019-11-30 ENCOUNTER — Encounter: Payer: Self-pay | Admitting: Addiction (Substance Use Disorder)

## 2019-11-30 ENCOUNTER — Other Ambulatory Visit: Payer: Self-pay

## 2019-11-30 DIAGNOSIS — F3181 Bipolar II disorder: Secondary | ICD-10-CM | POA: Diagnosis not present

## 2019-11-30 DIAGNOSIS — F4323 Adjustment disorder with mixed anxiety and depressed mood: Secondary | ICD-10-CM
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
## 2020-01-01 ENCOUNTER — Telehealth: Payer: Self-pay | Admitting: Family Medicine

## 2020-01-01 ENCOUNTER — Encounter: Payer: Self-pay | Admitting: Addiction (Substance Use Disorder)

## 2020-01-01 ENCOUNTER — Other Ambulatory Visit: Payer: Self-pay

## 2020-01-01 ENCOUNTER — Ambulatory Visit (INDEPENDENT_AMBULATORY_CARE_PROVIDER_SITE_OTHER): Payer: BC Managed Care – PPO | Admitting: Addiction (Substance Use Disorder)

## 2020-01-01 DIAGNOSIS — F3181 Bipolar II disorder: Secondary | ICD-10-CM

## 2020-01-01 DIAGNOSIS — F4323 Adjustment disorder with mixed anxiety and depressed mood: Secondary | ICD-10-CM | POA: Diagnosis not present

## 2020-01-01 NOTE — Progress Notes (Signed)
      [facility] Counselor/Therapist Progress Note  Patient ID: Corey Martin, MRN: [id number],    Date: 01/01/2020  Time Spent: 11:05-12:02 57 mins  Treatment Type: Individual Therapy  Reported Symptoms: so stressed, worried, etc.  Mental Status Exam:  Appearance:   Neat     Behavior:  Appropriate  Motor:  Normal  Speech/Language:   Normal Rate  Affect:  Congruent  Mood:  anxious  Thought process:  normal  Thought content:    WNL  Sensory/Perceptual disturbances:    WNL  Orientation:  x4  Attention:  Good  Concentration:  Good  Memory:  WNL  Fund of knowledge:   Good  Insight:    Good  Judgment:   Good  Impulse Control:  Good   Risk Assessment: Danger to Self:  No Self-injurious Behavior: No Danger to Others: No Duty to Warn:no Physical Aggression / Violence:No  Access to Firearms a concern: No  Gang Involvement:No   Subjective: Client reported being very stressed and overwhelmed due to being 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 Client described struggling to have time with his family and needing to ask for time for them. Therapist used MI & roleplay & CBT with client to support him in processing these stressors, understanding his thoughts that lead to overworking, and drawing boundaries with church duties. Client reported trying to delegate some to other pastors and also talking with his wife about areas they could cut things out of his life. Client says he keeps telling everyone: "dont worry its going to take time." but struggling to letting go and surrendering. Therapist used mindfulness with client to support him in learning to ground and be in the present, helping him to have a realistic expectation of what he has time to do.   Interventions: Cognitive Behavioral Therapy, Mindfulness Meditation and Motivational  Interviewing  Diagnosis:   ICD-10-CM   1. Bipolar II disorder (HCC)  F31.81   2. Adjustment disorder with mixed anxiety and depressed mood  F43.23     Plan of Care: Client to return for weekly therapy with [name], therapist, to review again in 6 months. Client to engage in positive self talk and challenging negative internal ruminationsand self talk causing client to be overly anxious and worriedusing CBT, on daily practice. Client to engage in mindfulness: ie body scans each eveneing to help process and discharge emotional distress & recognize emotions. Client to utilize BSP (brainspotting) with therapist to help client regulate their anxiety in a somatic- felt body sense way: (ieby working to reducemuscle tension,ruminations,increased heart rate, constant worrying and feeling"hyper" ) by decreasing anxietyby 33% in the next 6 months. Client to prioritize sleep 8+ hours each week night AEB going to bed by 10pm each night. Client participated in the treatment planning of their therapy.   [name], LCSW, LCAS, CCTP, CCS-I, BSP

## 2020-01-01 NOTE — Telephone Encounter (Signed)
Pt's Rx refills tied to OV/ Appt--Called pt to scheduled but was told because no doctor & that he rcvd a letter advising him to go elsewhere frm provider, he may not be coming back  & is seeking a diff PCP (removing provider info).  --glh

## 2020-01-22 ENCOUNTER — Other Ambulatory Visit: Payer: Self-pay | Admitting: Physician Assistant

## 2020-01-22 DIAGNOSIS — E8881 Metabolic syndrome: Secondary | ICD-10-CM

## 2020-02-13 ENCOUNTER — Encounter: Payer: Self-pay | Admitting: Addiction (Substance Use Disorder)

## 2020-02-13 ENCOUNTER — Other Ambulatory Visit: Payer: Self-pay

## 2020-02-13 ENCOUNTER — Ambulatory Visit (INDEPENDENT_AMBULATORY_CARE_PROVIDER_SITE_OTHER): Payer: BC Managed Care – PPO | Admitting: Addiction (Substance Use Disorder)

## 2020-02-13 DIAGNOSIS — F4323 Adjustment disorder with mixed anxiety and depressed mood: Secondary | ICD-10-CM

## 2020-02-13 DIAGNOSIS — F3181 Bipolar II disorder: Secondary | ICD-10-CM | POA: Diagnosis not present

## 2020-02-13 NOTE — Progress Notes (Signed)
      [facility] Counselor/Therapist Progress Note  Patient ID: Corey Martin, MRN: [id number],    Date: 02/13/2020  Time Spent: 56 mins  Treatment Type: Individual Therapy  Reported Symptoms: stressed, overdoing things, running, triggered.  Mental Status Exam:  Appearance:   Casual and Neat     Behavior:  Appropriate  Motor:  Normal  Speech/Language:   Normal Rate  Affect:  Congruent  Mood:  normal and stressed  Thought process:  normal  Thought content:    WNL  Sensory/Perceptual disturbances:    WNL  Orientation:  x4  Attention:  Good  Concentration:  Good  Memory:  WNL  Fund of knowledge:   Good  Insight:    Good  Judgment:   Good  Impulse Control:  Good   Risk Assessment: Danger to Self:  No Self-injurious Behavior: No Danger to Others: No Duty to Warn:no Physical Aggression / Violence:No  Access to Firearms a concern: No  Gang Involvement:No   Subjective: Client reported feeling like he overdoes things and needs to learn to rest. Client processed his understanding that he is an extrovert that helps him to go get busy and be social, but understanding that its overbearing and stressful for others, causing chaos in his work and home life. Therapist used MI & CBt with client to support him as he processed his shortcoming patterns and help him find the thoughts that lead him to more unhealthy behaviors, esp in his marriage when [employer]. Therapist also used psychoeducation about triggers, cravings, nervous system activation (sympathetic), trauma, the brain and seeking safety and encouraged client to read The Body Keeps the Score to learn more about it. Client reported his desire to bring his wife to therapy next session to process their dynamic and stress it causes him along with issues of his mean spiritedness while fighting with her. Therapist also used mindfulness: progressive muscle relaxation with client to encourage grounding his body and helping him to slow  down and discharge stress. Therapist also taught client DBT to increase distress tolerance to help him better manage stress, frustrations, cravings, etc.   Interventions: Cognitive Behavioral Therapy, Mindfulness Meditation, Motivational Interviewing and Psycho-education/Bibliotherapy  Diagnosis:   ICD-10-CM   1. Bipolar II disorder (HCC)  F31.81   2. Adjustment disorder with mixed anxiety and depressed mood  F43.23     Plan of Care: Client to return for weekly therapy with [name], therapist, to review again in 6 months. Client to engage in positive self talk and challenging negative internal ruminationsand self talk causing client to be overly anxious and worriedusing CBT, on daily practice. Client to engage in mindfulness: ie body scans each eveneing to help process and discharge emotional distress & recognize emotions. Client to utilize BSP (brainspotting) with therapist to help client regulate their anxiety in a somatic- felt body sense way: (ieby working to reducemuscle tension,ruminations,increased heart rate, constant worrying and feeling"hyper" ) by decreasing anxietyby 33% in the next 6 months. Client to prioritize sleep 8+ hours each week night AEB going to bed by 10pm each night. Client participated in the treatment planning of their therapy.   [name], LCSW, LCAS, CCTP, CCS-I, BSP

## 2020-02-14 ENCOUNTER — Ambulatory Visit: Payer: BC Managed Care – PPO | Admitting: Addiction (Substance Use Disorder)

## 2020-02-19 ENCOUNTER — Other Ambulatory Visit: Payer: Self-pay | Admitting: Family Medicine

## 2020-02-19 DIAGNOSIS — E559 Vitamin D deficiency, unspecified: Secondary | ICD-10-CM

## 2020-02-22 ENCOUNTER — Other Ambulatory Visit: Payer: Self-pay | Admitting: Family Medicine

## 2020-02-22 DIAGNOSIS — E781 Pure hyperglyceridemia: Secondary | ICD-10-CM

## 2020-02-22 DIAGNOSIS — I159 Secondary hypertension, unspecified: Secondary | ICD-10-CM

## 2020-03-13 ENCOUNTER — Encounter: Payer: Self-pay | Admitting: Addiction (Substance Use Disorder)

## 2020-03-13 ENCOUNTER — Other Ambulatory Visit: Payer: Self-pay

## 2020-03-13 ENCOUNTER — Ambulatory Visit (INDEPENDENT_AMBULATORY_CARE_PROVIDER_SITE_OTHER): Payer: BC Managed Care – PPO | Admitting: Addiction (Substance Use Disorder)

## 2020-03-13 DIAGNOSIS — F4323 Adjustment disorder with mixed anxiety and depressed mood: Secondary | ICD-10-CM | POA: Diagnosis not present

## 2020-03-13 NOTE — Progress Notes (Signed)
      [facility] Counselor/Therapist Progress Note  Patient ID: Corey Martin, MRN: [id number],    Date: 03/13/2020  Time Spent: 56 mins  Treatment Type: Individual Therapy  Reported Symptoms: irritable, upset, regretful.  Mental Status Exam:  Appearance:   Casual and Neat     Behavior:  Appropriate  Motor:  Normal  Speech/Language:   Normal Rate  Affect:  Congruent  Mood:  irritable and stressed  Thought process:  normal  Thought content:    WNL  Sensory/Perceptual disturbances:    WNL  Orientation:  x4  Attention:  Good  Concentration:  Good  Memory:  WNL  Fund of knowledge:   Good  Insight:    Good  Judgment:   Good  Impulse Control:  Good   Risk Assessment: Danger to Self:  No Self-injurious Behavior: No Danger to Others: No Duty to Warn:no Physical Aggression / Violence:No  Access to Firearms a concern: No  Gang Involvement:No   Subjective: Client reported feeling stressed and irritable today in session. Client his wife to session to discuss his reactions to things in their life/marriage/etc. Client processed the dynamic at their house and therapist used MI & family systems to support client and client's wife Marcelino Duster in discussing their dynamic due to internal family stressors. Therapist also used EFT with client to help him learn to de-escalate his emotional reactions by making sense of them and then working to regulate them before he responds to his wife. Therapist also used CBT with client to help client tease out his thoughts fueling his irritability and verbal outlashes at his wife.  Interventions: Cognitive Behavioral Therapy, Motivational Interviewing, Family Systems and EFT  Diagnosis:   ICD-10-CM   1. Adjustment disorder with mixed anxiety and depressed mood  F43.23     Plan of Care: Client to return for weekly therapy with [name], therapist, to review again in 6 months. Client to engage in positive self talk and challenging negative  internal ruminationsand self talk causing client to be overly anxious and worriedusing CBT, on daily practice. Client to engage in mindfulness: ie body scans each eveneing to help process and discharge emotional distress & recognize emotions. Client to utilize BSP (brainspotting) with therapist to help client regulate their anxiety in a somatic- felt body sense way: (ieby working to reducemuscle tension,ruminations,increased heart rate, constant worrying and feeling"hyper" ) by decreasing anxietyby 33% in the next 6 months. Client to prioritize sleep 8+ hours each week night AEB going to bed by 10pm each night. Client participated in the treatment planning of their therapy.   [name], LCSW, LCAS, CCTP, CCS-I, BSP

## 2020-03-26 ENCOUNTER — Telehealth: Payer: Self-pay | Admitting: Physician Assistant

## 2020-03-26 DIAGNOSIS — I159 Secondary hypertension, unspecified: Secondary | ICD-10-CM

## 2020-03-26 MED ORDER — LISINOPRIL-HYDROCHLOROTHIAZIDE 20-12.5 MG PO TABS: 1.0000 | ORAL_TABLET | Freq: Every day | ORAL | 0 refills | Status: DC

## 2020-03-26 NOTE — Telephone Encounter (Signed)
Patient is requesting a refill of his lisinopril-HCTZ, he is aware he is overdue for a f/u and sch our first available but it is not until Oct. Can we send in a refill to patient's [facility] on [address]. Patient states he has been out a week and is starting to feel the effects.

## 2020-03-26 NOTE — Telephone Encounter (Signed)
Refill sent to requested pharmacy. AS< CMA 

## 2020-03-26 NOTE — Addendum Note (Signed)
Addended by: Sylvester Harder on: 03/26/2020 01:46 PM   Modules accepted: Orders

## 2020-04-04 DIAGNOSIS — E669 Obesity, unspecified: Secondary | ICD-10-CM | POA: Diagnosis not present

## 2020-04-04 DIAGNOSIS — I10 Essential (primary) hypertension: Secondary | ICD-10-CM | POA: Diagnosis not present

## 2020-04-08 ENCOUNTER — Encounter: Payer: Self-pay | Admitting: Addiction (Substance Use Disorder)

## 2020-04-08 ENCOUNTER — Ambulatory Visit (INDEPENDENT_AMBULATORY_CARE_PROVIDER_SITE_OTHER): Payer: BC Managed Care – PPO | Admitting: Addiction (Substance Use Disorder)

## 2020-04-08 ENCOUNTER — Other Ambulatory Visit: Payer: Self-pay

## 2020-04-08 DIAGNOSIS — F3181 Bipolar II disorder: Secondary | ICD-10-CM | POA: Diagnosis not present

## 2020-04-08 NOTE — Progress Notes (Signed)
      [facility] Counselor/Therapist Progress Note   Patient ID: Corey Martin, MRN: [id number],    Date: 04/08/2020  Time Spent:  Treatment Type: Individual Therapy  Reported Symptoms: despair, depression, grief.   Mental Status Exam:  Appearance:   Casual and Neat     Behavior:  Appropriate  Motor:  Normal  Speech/Language:   Normal Rate  Affect:  Appropriate, Congruent and Tearful  Mood:  sad and stressed  Thought process:  normal  Thought content:    WNL  Sensory/Perceptual disturbances:    WNL  Orientation:  x4  Attention:  Good  Concentration:  Good  Memory:  WNL  Fund of knowledge:   Good  Insight:    Good  Judgment:   Good  Impulse Control:  Good   Risk Assessment: Danger to Self:  No Self-injurious Behavior: No Danger to Others: No Duty to Warn:no Physical Aggression / Violence:No  Access to Firearms a concern: No  Gang Involvement:No   Subjective: Client reported feeling despair, depression, grief. 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 Therapist helped client emotionally regulate and meditate using mindfulness. Client processed about the past session withhis wife and reported it was helpful for them to process, but reported irritation that his wife still wont go to her own therapy that would help her to process emotions that he cant help her to process. Therapist assessed for safety and client denied SI/HI/AVH.   Interventions: Cognitive Behavioral Therapy, Motivational Interviewing and Grief Therapy  Diagnosis:   ICD-10-CM   1. Bipolar II disorder (HCC)  F31.81    Plan of Care: Client to return for weekly therapy with [name], therapist, to review again in 6 months. Client to  engage in positive self talk and challenging negative internal ruminationsand self talk causing client to be overly anxious and worriedusing CBT, on daily practice. Client to engage in mindfulness: ie body scans each eveneing to help process and discharge emotional distress & recognize emotions. Client to utilize BSP (brainspotting) with therapist to help client regulate their anxiety in a somatic- felt body sense way: (ieby working to reducemuscle tension,ruminations,increased heart rate, constant worrying and feeling"hyper" ) by decreasing anxietyby 33% in the next 6 months. Client to prioritize sleep 8+ hours each week night AEB going to bed by 10pm each night. Client participated in the treatment planning of their therapy.   [name], LCSW, LCAS, CCTP, CCS-I, BSP

## 2020-04-29 ENCOUNTER — Ambulatory Visit: Payer: BC Managed Care – PPO | Admitting: Physician Assistant

## 2020-05-08 DIAGNOSIS — Z20822 Contact with and (suspected) exposure to covid-19: Secondary | ICD-10-CM | POA: Diagnosis not present

## 2020-05-13 ENCOUNTER — Ambulatory Visit: Payer: BC Managed Care – PPO | Admitting: Addiction (Substance Use Disorder)

## 2020-05-14 ENCOUNTER — Other Ambulatory Visit: Payer: Self-pay

## 2020-05-14 ENCOUNTER — Ambulatory Visit (INDEPENDENT_AMBULATORY_CARE_PROVIDER_SITE_OTHER): Payer: BC Managed Care – PPO | Admitting: Addiction (Substance Use Disorder)

## 2020-05-14 DIAGNOSIS — F3181 Bipolar II disorder: Secondary | ICD-10-CM

## 2020-05-14 NOTE — Progress Notes (Signed)
      [facility] Counselor/Therapist Progress Note   Patient ID: Nikesh Teschner, MRN: [id number],    Date: 05/14/2020  Time Spent:  Treatment Type: Individual Therapy  Reported Symptoms: overwhelmed  Mental Status Exam:  Appearance:   Casual and Neat     Behavior:  Appropriate  Motor:  Normal  Speech/Language:   Normal Rate  Affect:  Appropriate, Congruent and Tearful  Mood:  stressed  Thought process:  normal  Thought content:    WNL  Sensory/Perceptual disturbances:    WNL  Orientation:  x4  Attention:  Good  Concentration:  Good  Memory:  WNL  Fund of knowledge:   Good  Insight:    Good  Judgment:   Good  Impulse Control:  Good   Risk Assessment: Danger to Self:  No Self-injurious Behavior: No Danger to Others: No Duty to Warn:no Physical Aggression / Violence:No  Access to Firearms a concern: No  Gang Involvement:No   Subjective: Client reported feeling distressed after a Nurse, adult went ary. Client processed dynamics and therapist used MI to validate client's distress and support him & psychodynamic techniques to help client find agency to lead the meeting in a process, not content, direction to get curious and gain more cohesion with one another. Client processed how COVID and discomfort bred resentment and irritability that he feels are infiltrating the church and his concerns for them.\  Interventions: Motivational Interviewing and Gestalt/Psychodrama  Diagnosis:   ICD-10-CM   1. Bipolar II disorder (HCC)  F31.81     Plan of Care: Client to return for weekly therapy with [name], therapist, to review again in 6 months. Client to engage in positive self talk and challenging negative internal ruminationsand self talk causing client to be overly anxious and worriedusing CBT, on daily practice. Client to engage in mindfulness: ie body scans each eveneing to help process and discharge emotional distress & recognize emotions. Client to  utilize BSP (brainspotting) with therapist to help client regulate their anxiety in a somatic- felt body sense way: (ieby working to reducemuscle tension,ruminations,increased heart rate, constant worrying and feeling"hyper" ) by decreasing anxietyby 33% in the next 6 months. Client to prioritize sleep 8+ hours each week night AEB going to bed by 10pm each night. Client participated in the treatment planning of their therapy.   [name], LCSW, LCAS, CCTP, CCS-I, BSP

## 2020-05-15 DIAGNOSIS — Z125 Encounter for screening for malignant neoplasm of prostate: Secondary | ICD-10-CM | POA: Diagnosis not present

## 2020-05-15 DIAGNOSIS — Z0000 Encounter for general adult medical examination without abnormal findings: Secondary | ICD-10-CM | POA: Diagnosis not present

## 2020-05-15 DIAGNOSIS — R7303 Prediabetes: Secondary | ICD-10-CM | POA: Diagnosis not present

## 2020-05-15 DIAGNOSIS — E785 Hyperlipidemia, unspecified: Secondary | ICD-10-CM | POA: Diagnosis not present

## 2020-05-22 DIAGNOSIS — Z23 Encounter for immunization: Secondary | ICD-10-CM | POA: Diagnosis not present

## 2020-05-22 DIAGNOSIS — I10 Essential (primary) hypertension: Secondary | ICD-10-CM | POA: Diagnosis not present

## 2020-05-22 DIAGNOSIS — Z0000 Encounter for general adult medical examination without abnormal findings: Secondary | ICD-10-CM | POA: Diagnosis not present

## 2020-06-10 ENCOUNTER — Ambulatory Visit (INDEPENDENT_AMBULATORY_CARE_PROVIDER_SITE_OTHER): Payer: BC Managed Care – PPO | Admitting: Addiction (Substance Use Disorder)

## 2020-06-10 ENCOUNTER — Other Ambulatory Visit: Payer: Self-pay

## 2020-06-10 ENCOUNTER — Encounter: Payer: Self-pay | Admitting: Addiction (Substance Use Disorder)

## 2020-06-10 DIAGNOSIS — F3181 Bipolar II disorder: Secondary | ICD-10-CM

## 2020-06-10 DIAGNOSIS — F4323 Adjustment disorder with mixed anxiety and depressed mood: Secondary | ICD-10-CM | POA: Diagnosis not present

## 2020-06-10 NOTE — Progress Notes (Signed)
      [facility] Counselor/Therapist Progress Note   Patient ID: Corey Martin, MRN: [id number],    Date: 06/10/2020  Time Spent:  Treatment Type: Individual Therapy  Reported Symptoms: super busy, miserable.   Mental Status Exam:  Appearance:   Casual and Neat     Behavior:  Appropriate  Motor:  Normal  Speech/Language:   Normal Rate  Affect:  Appropriate and Congruent  Mood:  irritable  Thought process:  normal  Thought content:    WNL  Sensory/Perceptual disturbances:    WNL  Orientation:  x4  Attention:  Good  Concentration:  Good  Memory:  WNL  Fund of knowledge:   Good  Insight:    Good  Judgment:   Good  Impulse Control:  Good   Risk Assessment: Danger to Self:  No Self-injurious Behavior: No Danger to Others: No Duty to Warn:no Physical Aggression / Violence:No  Access to Firearms a concern: No  Gang Involvement:No   Subjective: Client reported struggling with overdoing everything and his wife to do the same. Client processed his wife making self-created tasks and avoiding change at all costs to him and his family. Therapist used MI, SFT, and roleplaying with client to support him in helping him discuss with his wife her need to go to counseling instead of piling all her feelings onto him. Client reported struggling with being an over-indulger and dealing with compulsive behaviors. Client reported getting really distressed trying to get back into a healthy pattern of eating. Therapist used SFT & RPT with client to help hm create SMART goals and motivation. Client processed his [employer] in hopes to figure out how to motivate his healthy eating and weightloss. Therapist introduced client to idea of intuitive eating and encouraged client to practice model.   Interventions: Roleplay, Motivational Interviewing, Solution-Oriented/Positive Psychology and RPT  Diagnosis:   ICD-10-CM   1. Bipolar II disorder (HCC)  F31.81   2. Adjustment disorder with  mixed anxiety and depressed mood  F43.23     Plan of Care: Client to return for weekly therapy with [name], therapist, to review again in 6 months. Client to engage in positive self talk and challenging negative internal ruminationsand self talk causing client to be overly anxious and worriedusing CBT, on daily practice. Client to engage in mindfulness: ie body scans each eveneing to help process and discharge emotional distress & recognize emotions. Client to utilize BSP (brainspotting) with therapist to help client regulate their anxiety in a somatic- felt body sense way: (ieby working to reducemuscle tension,ruminations,increased heart rate, constant worrying and feeling"hyper" ) by decreasing anxietyby 33% in the next 6 months. Client to prioritize sleep 8+ hours each week night AEB going to bed by 10pm each night. Client participated in the treatment planning of their therapy.   [name], LCSW, LCAS, CCTP, CCS-I, BSP

## 2020-06-12 ENCOUNTER — Ambulatory Visit: Payer: Self-pay

## 2020-06-12 DIAGNOSIS — R21 Rash and other nonspecific skin eruption: Secondary | ICD-10-CM | POA: Diagnosis not present

## 2020-06-27 DIAGNOSIS — Z1212 Encounter for screening for malignant neoplasm of rectum: Secondary | ICD-10-CM | POA: Diagnosis not present

## 2020-07-11 ENCOUNTER — Other Ambulatory Visit: Payer: Self-pay

## 2020-07-11 ENCOUNTER — Ambulatory Visit (INDEPENDENT_AMBULATORY_CARE_PROVIDER_SITE_OTHER): Payer: BC Managed Care – PPO | Admitting: Addiction (Substance Use Disorder)

## 2020-07-11 DIAGNOSIS — F3181 Bipolar II disorder: Secondary | ICD-10-CM

## 2020-07-11 NOTE — Progress Notes (Signed)
      [facility] Counselor/Therapist Progress Note   Patient ID: Corey Martin, MRN: [id number],    Date: 07/11/2020  Time Spent:  Treatment Type: Individual Therapy  Reported Symptoms: super busy, miserable.   Mental Status Exam:  Appearance:   Casual and Neat     Behavior:  Appropriate  Motor:  Normal  Speech/Language:   Normal Rate  Affect:  Appropriate and Congruent  Mood:  irritable  Thought process:  normal  Thought content:    WNL  Sensory/Perceptual disturbances:    WNL  Orientation:  x4  Attention:  Good  Concentration:  Good  Memory:  WNL  Fund of knowledge:   Good  Insight:    Good  Judgment:   Good  Impulse Control:  Good   Risk Assessment: Danger to Self:  No Self-injurious Behavior: No Danger to Others: No Duty to Warn:no Physical Aggression / Violence:No  Access to Firearms a concern: No  Gang Involvement:No   Subjective: Client reported feeling stressed by cleaning and preparing for the holidays coming up and hosting family and church parties. Client discussed family issues bec of overcomitting that both he and his wife do. Client processed feeling guilty for not having a lot of motivation for working at church, but just being over-busy. 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 Therapist used MI to support client as he processed the stressors and SFT with client to help him come to a conclusion with how to make steps towards simplifying their family life.   Interventions: Motivational Interviewing and Solution-Oriented/Positive Psychology  Diagnosis:   ICD-10-CM   1. Bipolar II disorder (HCC)  F31.81      Plan of Care: Client to return for weekly therapy with [name], therapist, to review again in 6 months. Client to engage in positive self talk and challenging negative internal ruminationsand self talk causing client to be overly anxious and  worriedusing CBT, on daily practice. Client to engage in mindfulness: ie body scans each eveneing to help process and discharge emotional distress & recognize emotions. Client to utilize BSP (brainspotting) with therapist to help client regulate their anxiety in a somatic- felt body sense way: (ieby working to reducemuscle tension,ruminations,increased heart rate, constant worrying and feeling"hyper" ) by decreasing anxietyby 33% in the next 6 months. Client to prioritize sleep 8+ hours each week night AEB going to bed by 10pm each night. Client participated in the treatment planning of their therapy.   [name], LCSW, LCAS, CCTP, CCS-I, BSP

## 2020-08-12 ENCOUNTER — Ambulatory Visit: Payer: BC Managed Care – PPO | Admitting: Addiction (Substance Use Disorder)

## 2020-09-11 ENCOUNTER — Ambulatory Visit (INDEPENDENT_AMBULATORY_CARE_PROVIDER_SITE_OTHER): Payer: BC Managed Care – PPO | Admitting: Addiction (Substance Use Disorder)

## 2020-09-11 ENCOUNTER — Other Ambulatory Visit: Payer: Self-pay

## 2020-09-11 DIAGNOSIS — F3181 Bipolar II disorder: Secondary | ICD-10-CM | POA: Diagnosis not present

## 2020-09-11 NOTE — Progress Notes (Signed)
      [facility] Counselor/Therapist Progress Note   Patient ID: Corey Martin, MRN: [id number],    Date: 09/11/2020  Time Spent:  Treatment Type: Individual Therapy  Reported Symptoms: hopeless, burdened.  Mental Status Exam:  Appearance:   Casual and Neat     Behavior:  Appropriate  Motor:  Normal  Speech/Language:   Normal Rate  Affect:  Appropriate and Congruent  Mood:  anxious and depressed  Thought process:  normal  Thought content:    WNL  Sensory/Perceptual disturbances:    WNL  Orientation:  x4  Attention:  Good  Concentration:  Good  Memory:  WNL  Fund of knowledge:   Good  Insight:    Fair  Judgment:   Fair  Impulse Control:  Good   Risk Assessment: Danger to Self:  No Self-injurious Behavior: No Danger to Others: No Duty to Warn:no Physical Aggression / Violence:No  Access to Firearms a concern: No  Gang Involvement:No   Subjective: Client reported feeling hopeless and 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 Client processed how COVID has stolen his church's hope and he has been preaching hope to them for over a month, but feels burnt out and is becoming pessimistic and resentful that they dont offer him hope back/ concede to whatever he says in meetings related to dealing with issues surrounding financial issues. Client expressed his anxiety around financial church burdens and his lack of ability to slow his negative thoughts. Therapist used MI & CBT with client to help support client and help challenge his thoughts of having to "do it all". Therapist also used SFT with client to help him recognize how he is trying to be the martyr and carry a burden that "only Christ is meant to carry" for his church and him also. Client processed how these things are affecting his MH stability but denied SI/HI/AVH.  Interventions: Cognitive Behavioral Therapy, Motivational Interviewing and Solution-Oriented/Positive Psychology  Diagnosis:   ICD-10-CM    1. Bipolar II disorder (HCC)  F31.81      Plan of Care: Client to return for weekly therapy with [name], therapist, to review again in 6 months. Client to engage in positive self talk and challenging negative internal ruminationsand self talk causing client to be overly anxious and worriedusing CBT, on daily practice. Client to engage in mindfulness: ie body scans each eveneing to help process and discharge emotional distress & recognize emotions. Client to utilize BSP (brainspotting) with therapist to help client regulate their anxiety in a somatic- felt body sense way: (ieby working to reducemuscle tension,ruminations,increased heart rate, constant worrying and feeling"hyper" ) by decreasing anxietyby 33% in the next 6 months. Client to prioritize sleep 8+ hours each week night AEB going to bed by 10pm each night. Client participated in the treatment planning of their therapy.   [name], LCSW, LCAS, CCTP, CCS-I, BSP

## 2020-09-17 DIAGNOSIS — R5081 Fever presenting with conditions classified elsewhere: Secondary | ICD-10-CM | POA: Diagnosis not present

## 2020-09-17 DIAGNOSIS — R197 Diarrhea, unspecified: Secondary | ICD-10-CM | POA: Diagnosis not present

## 2020-10-17 ENCOUNTER — Ambulatory Visit: Payer: BC Managed Care – PPO | Admitting: Addiction (Substance Use Disorder)

## 2020-10-30 ENCOUNTER — Other Ambulatory Visit: Payer: Self-pay

## 2020-10-30 ENCOUNTER — Ambulatory Visit (INDEPENDENT_AMBULATORY_CARE_PROVIDER_SITE_OTHER): Payer: BC Managed Care – PPO | Admitting: Addiction (Substance Use Disorder)

## 2020-10-30 DIAGNOSIS — F3181 Bipolar II disorder: Secondary | ICD-10-CM

## 2020-10-30 NOTE — Progress Notes (Signed)
      [facility] Counselor/Therapist Progress Note   Patient ID: Corey Martin, MRN: [id number],    Date: 10/30/2020  Time Spent:  Treatment Type: Individual Therapy  Reported Symptoms: ramping up, trouble with executive functioning   Mental Status Exam:  Appearance:   Casual and Neat     Behavior:  Appropriate  Motor:  Normal  Speech/Language:   Normal Rate  Affect:  Appropriate and Congruent  Mood:  anxious  Thought process:  flight of ideas  Thought content:    Rumination  Sensory/Perceptual disturbances:    WNL  Orientation:  x4  Attention:  Good  Concentration:  Good  Memory:  WNL  Fund of knowledge:   Good  Insight:    Fair  Judgment:   Fair  Impulse Control:  Good   Risk Assessment: Danger to Self:  No Self-injurious Behavior: No Danger to Others: No Duty to Warn:no Physical Aggression / Violence:No  Access to Firearms a concern: No  Gang Involvement:No   Subjective: Client reported struggling ramping up and signing up for too much on regular basis. Client expressed consequences of that including trouble with executive functioning. Client reported struggling with a loop/cycle of over-involving himself in activities. Client struggling with finding a place he could shave responsibilities that make him distressed. Therapist assesed for client MH stability but denied SI/HI/AVH.  Interventions: Motivational Interviewing and Solution-Oriented/Positive Psychology  Diagnosis:   ICD-10-CM   1. Bipolar II disorder (HCC)  F31.81     Plan of Care: Client to return for weekly therapy with [name], therapist, to review again in 6 months. Client to engage in positive self talk and challenging negative internal ruminationsand self talk causing client to be overly anxious and worriedusing CBT, on daily practice. Client to engage in mindfulness: ie body scans each eveneing to help process and discharge emotional distress & recognize emotions. Client to  utilize BSP (brainspotting) with therapist to help client regulate their anxiety in a somatic- felt body sense way: (ieby working to reducemuscle tension,ruminations,increased heart rate, constant worrying and feeling"hyper" ) by decreasing anxietyby 33% in the next 6 months. Client to prioritize sleep 8+ hours each week night AEB going to bed by 10pm each night. Client participated in the treatment planning of their therapy.   [name], LCSW, LCAS, CCTP, CCS-I, BSP

## 2020-12-11 ENCOUNTER — Ambulatory Visit: Payer: BC Managed Care – PPO | Admitting: Addiction (Substance Use Disorder)

## 2020-12-11 ENCOUNTER — Other Ambulatory Visit: Payer: Self-pay

## 2020-12-11 ENCOUNTER — Encounter: Payer: Self-pay | Admitting: Behavioral Health

## 2020-12-11 ENCOUNTER — Ambulatory Visit (INDEPENDENT_AMBULATORY_CARE_PROVIDER_SITE_OTHER): Payer: BC Managed Care – PPO | Admitting: Behavioral Health

## 2020-12-11 VITALS — BP 138/92 | HR 87 | Ht 70.0 in | Wt 283.0 lb

## 2020-12-11 DIAGNOSIS — F3181 Bipolar II disorder: Secondary | ICD-10-CM

## 2020-12-11 DIAGNOSIS — F4323 Adjustment disorder with mixed anxiety and depressed mood: Secondary | ICD-10-CM

## 2020-12-11 DIAGNOSIS — F4321 Adjustment disorder with depressed mood: Secondary | ICD-10-CM | POA: Diagnosis not present

## 2020-12-11 DIAGNOSIS — F411 Generalized anxiety disorder: Secondary | ICD-10-CM

## 2020-12-11 DIAGNOSIS — F4320 Adjustment disorder, unspecified: Secondary | ICD-10-CM

## 2020-12-11 MED ORDER — LORAZEPAM 0.5 MG PO TABS: 0.5000 mg | ORAL_TABLET | Freq: Three times a day (TID) | ORAL | 0 refills | Status: DC | PRN

## 2020-12-11 MED ORDER — LAMOTRIGINE 100 MG PO TABS: 100.0000 mg | ORAL_TABLET | Freq: Every day | ORAL | 1 refills | Status: DC

## 2020-12-11 MED ORDER — BUPROPION HCL ER (XL) 300 MG PO TB24: 300.0000 mg | ORAL_TABLET | Freq: Every day | ORAL | 0 refills | Status: DC

## 2020-12-11 NOTE — Progress Notes (Signed)
[facility] MD/PA/NP Initial Note  12/11/2020 8:49 AM Corey Martin Police  MRN:  [id number]  Chief Complaint:  Chief Complaint    Depression; Anxiety; Establish Care      HPI:  45 year old male presented to this office for initial visit and to establish care. He said that he is seeing Corey Martin for therapy on a regular basis, and his doctor requested he see psychiatry for medication management of mood stabilizers.He is a prior long term patient of the mood treatment center in [place]. He said that that the last few weeks have been very challenging and he had sudden loss of his father this week. Says that he is attending funeral today. Pt reports that he his a Corey Martin and sometimes the multi roles involved with adds to the level of stress, and grief he is feeling today. He says that he has been very stable on his current regimen and does not want to change at this time. He says that he understands that the increased anxiety and grief are situational. He says that he  had to take a few more Ativan the last few days due to the situation. He normally takes them very infrequently. Anxiety today is 8, and situational depression is 8. He reports that he is heavy sleeper and usually get adequate sleep. No mania. No psychosis, SI/HI.  No prior psychiatric medication failures noted      Visit Diagnosis:    ICD-10-CM   1. Bipolar II disorder (HCC)  F31.81 lamoTRIgine (LAMICTAL) 100 MG tablet  2. Generalized anxiety disorder  F41.1 LORazepam (ATIVAN) 0.5 MG tablet  3. Mood D/o   F43.23 buPROPion (WELLBUTRIN XL) 300 MG 24 hr tablet  4. Grief reaction  F43.21     Past Psychiatric History:   Past Medical History:  Past Medical History:  Diagnosis Date  . Bipolar disorder (HCC)   . Constipation   . Depression    bipolar depression  . Hypercholesteremia   . Hypertension   . Joint pain   . Knee pain   . Sleep apnea     Past Surgical History:  Procedure Laterality Date  . ADENOIDECTOMY     . KNEE SURGERY      Family Psychiatric History:   Family History:  Family History  Problem Relation Age of Onset  . Hyperlipidemia Mother   . Hypertension Mother   . Depression Mother   . Anxiety disorder Mother   . Obesity Mother   . Heart attack Father   . Hyperlipidemia Father   . Hypertension Father   . Obesity Father   . Sleep apnea Father   . Diabetes Maternal Grandmother     Social History:  Social History   Socioeconomic History  . Marital status: Married    Spouse name: Corey Martin  . Number of children: 1  . Years of education: Not on file  . Highest education level: Not on file  Occupational History  . Occupation: Lead pastor  Tobacco Use  . Smoking status: Never Smoker  . Smokeless tobacco: Never Used  Vaping Use  . Vaping Use: Never used  Substance and Sexual Activity  . Alcohol use: Yes    Alcohol/week: 3.0 standard drinks    Types: 1 Glasses of wine, 1 Cans of beer, 1 Shots of liquor per week  . Drug use: No  . Sexual activity: Yes    Birth control/protection: Condom    Comment: married  Other Topics Concern  . Not on file  Social History Narrative  . Not on file   Social Determinants of Health   Financial Resource Strain: Not on file  Food Insecurity: Not on file  Transportation Needs: Not on file  Physical Activity: Not on file  Stress: Not on file  Social Connections: Not on file    Allergies: No Known Allergies  Metabolic Disorder Labs: Lab Results  Component Value Date   HGBA1C 5.3 03/30/2018   No results found for: PROLACTIN Lab Results  Component Value Date   CHOL 181 08/30/2019   TRIG 61 08/30/2019   HDL 55 08/30/2019   CHOLHDL 3.3 08/30/2019   LDLCALC 114 (H) 08/30/2019   LDLCALC 125 (H) 03/30/2018   Lab Results  Component Value Date   TSH 2.700 03/30/2018   TSH 3.450 02/13/2018    Therapeutic Level Labs: No results found for: LITHIUM No results found for: VALPROATE No components found for:   CBMZ  Current Medications: Current Outpatient Medications  Medication Sig Dispense Refill  . Choline Fenofibrate (FENOFIBRIC ACID) 135 MG CPDR TAKE 1 CAPSULE BY MOUTH EVERY DAY 90 capsule 1  . lamoTRIgine (LAMICTAL) 100 MG tablet Take 1 tablet (100 mg total) by mouth daily. 90 tablet 1  . lisinopril-hydrochlorothiazide (ZESTORETIC) 20-12.5 MG tablet Take 1 tablet by mouth daily. **PATIENT NEEDS APT FOR FURTHER REFILLS** 60 tablet 0  . metFORMIN (GLUCOPHAGE) 500 MG tablet TAKE 1 TABLET (500 MG TOTAL) BY MOUTH 2 (TWO) TIMES DAILY WITH A MEAL. NO RF UNTIL OV 120 tablet 0  . vitamin B-12 (CYANOCOBALAMIN) 1000 MCG tablet Take 1 tablet (1,000 mcg total) by mouth daily.    . Vitamin D, Ergocalciferol, (DRISDOL) 1.25 MG (50000 UNIT) CAPS capsule Take 1 capsule (50,000 Units total) by mouth every 7 (seven) days. 12 capsule 1  . buPROPion (WELLBUTRIN XL) 300 MG 24 hr tablet Take 1 tablet (300 mg total) by mouth daily. Needs office visit 30 tablet 0  . LORazepam (ATIVAN) 0.5 MG tablet Take 1 tablet (0.5 mg total) by mouth every 8 (eight) hours as needed for anxiety. 30 tablet 0   No current facility-administered medications for this visit.    Medication Side Effects: none  Orders placed this visit:  No orders of the defined types were placed in this encounter.   Psychiatric Specialty Exam:  Review of Systems  Constitutional: Negative.   Allergic/Immunologic: Negative.   Psychiatric/Behavioral: Positive for dysphoric mood. The patient is nervous/anxious.     Blood pressure (!) 138/92, pulse 87, height 5\' 10"  (1.778 m), weight 283 lb (128.4 kg).Body mass index is 40.61 kg/m.  General Appearance: Casual, Neat and Well Groomed  Eye Contact:  Good  Speech:  Normal Rate  Volume:  Normal  Mood:  Depressed and Dysphoric  Affect:  Depressed and Tearful  Thought Process:  Coherent  Orientation:  Full (Time, Place, and Person)  Thought Content: Logical   Suicidal Thoughts:  No  Homicidal Thoughts:   No  Memory:  WNL  Judgement:  Good  Insight:  Good  Psychomotor Activity:  Normal  Concentration:  Concentration: Good  Recall:  Good  Fund of Knowledge: Good  Language: Good  Assets:  Desire for Improvement  ADL's:  Intact  Cognition: WNL  Prognosis:  Good   Screenings:  GAD-7   Flowsheet Row Office Visit from 01/11/2019 in [facility]  Total GAD-7 Score 4    PHQ2-9   Flowsheet Row Office Visit from 09/05/2019 in [facility]  [place] Office Visit from 01/11/2019 in [facility] at [facility] Visit from 03/30/2018 in [facility] Office Visit from 02/13/2018 in [facility] at [facility] Visit from 12/29/2017 in [facility] at [facility]  PHQ-2 Total Score 1 1 1  0 0  PHQ-9 Total Score 4 3 7  0 0      Receiving Psychotherapy: Yes   Treatment Plan/Recommendations:   Continue on Lamictal 100 mg daily Continue on Wellbutrin 300 mg daily Continue on 0.5 Ativan 3 times daily PRN Will report any worsening symptoms or changes promplty Continue therapy with Kayla Cain/ Address Grief next visit Greater than 50% of 45 min. face to face time with patient was spent on counseling and coordination of care. We discussed loss of his father and coping with grief. Reviewed medication regimen and refilled current medications.  Suggested patient discuss with PCP about cardiology work up due to strong family history. Patient agreed to f/u in 3 months.   , NP

## 2020-12-24 ENCOUNTER — Other Ambulatory Visit: Payer: Self-pay

## 2020-12-24 ENCOUNTER — Ambulatory Visit (INDEPENDENT_AMBULATORY_CARE_PROVIDER_SITE_OTHER): Payer: BC Managed Care – PPO | Admitting: Addiction (Substance Use Disorder)

## 2020-12-24 DIAGNOSIS — F4321 Adjustment disorder with depressed mood: Secondary | ICD-10-CM | POA: Diagnosis not present

## 2020-12-24 DIAGNOSIS — F3181 Bipolar II disorder: Secondary | ICD-10-CM | POA: Diagnosis not present

## 2020-12-24 NOTE — Progress Notes (Signed)
      [facility] Counselor/Therapist Progress Note   Patient ID: Corey Martin, MRN: [id number],    Date: 12/24/2020  Time Spent:  Treatment Type: Individual Therapy  Reported Symptoms: racing thoughts, full of grief.  Mental Status Exam:  Appearance:   Casual and Neat     Behavior:  Appropriate  Motor:  Normal  Speech/Language:   Normal Rate  Affect:  Appropriate and Congruent  Mood:  anxious  Thought process:  flight of ideas  Thought content:    Rumination  Sensory/Perceptual disturbances:    WNL  Orientation:  x4  Attention:  Good  Concentration:  Good  Memory:  WNL  Fund of knowledge:   Good  Insight:    Fair  Judgment:   Fair  Impulse Control:  Good   Risk Assessment: Danger to Self:  No Self-injurious Behavior: No Danger to Others: No Duty to Warn:no Physical Aggression / Violence:No  Access to Firearms a concern: No  Gang Involvement:No   Subjective: Client came in reporting a surprise grief- losing his father due to heart issues. Client reported such surprise and heartache after seeing his dad in bad health over the previous week and not being able to talk him into going to the ER. Client reported his dad's hx of heart attacks and his fear that his health will end up like his father's. Client having out of control obsessive fears and ruminations and therapist used MI & BSP & grief to support client in processing the pain and helping him to find a place of peace and calm despite the fear and ruminations. Therapist assesed for client MH stability but denied SI/HI/AVH.  Interventions: Motivational Interviewing and Grief Therapy & BSP  Diagnosis:   ICD-10-CM   1. Bipolar II disorder (HCC)  F31.81   2. Grief reaction  F43.21     Plan of Care: Client to return for weekly therapy with [name], therapist, to review again in 6 months. Client to engage in positive self talk and challenging negative internal ruminationsand self talk causing client to  be overly anxious and worriedusing CBT, on daily practice. Client to engage in mindfulness: ie body scans each eveneing to help process and discharge emotional distress & recognize emotions. Client to utilize BSP (brainspotting) with therapist to help client regulate their anxiety in a somatic- felt body sense way: (ieby working to reducemuscle tension,ruminations,increased heart rate, constant worrying and feeling"hyper" ) by decreasing anxietyby 33% in the next 6 months. Client to prioritize sleep 8+ hours each week night AEB going to bed by 10pm each night. Client participated in the treatment planning of their therapy.   [name], LCSW, LCAS, CCTP, CCS-I, BSP

## 2020-12-30 DIAGNOSIS — I10 Essential (primary) hypertension: Secondary | ICD-10-CM | POA: Diagnosis not present

## 2021-01-07 ENCOUNTER — Encounter: Payer: Self-pay | Admitting: Cardiology

## 2021-01-07 ENCOUNTER — Other Ambulatory Visit: Payer: Self-pay

## 2021-01-07 ENCOUNTER — Ambulatory Visit: Payer: BC Managed Care – PPO | Admitting: Cardiology

## 2021-01-07 VITALS — BP 130/83 | HR 72 | Temp 98.0°F | Resp 16 | Ht 70.0 in | Wt 294.0 lb

## 2021-01-07 DIAGNOSIS — Z8249 Family history of ischemic heart disease and other diseases of the circulatory system: Secondary | ICD-10-CM

## 2021-01-07 DIAGNOSIS — R0789 Other chest pain: Secondary | ICD-10-CM

## 2021-01-07 DIAGNOSIS — I10 Essential (primary) hypertension: Secondary | ICD-10-CM | POA: Diagnosis not present

## 2021-01-07 DIAGNOSIS — E7800 Pure hypercholesterolemia, unspecified: Secondary | ICD-10-CM | POA: Diagnosis not present

## 2021-01-07 DIAGNOSIS — Z6841 Body Mass Index (BMI) 40.0 and over, adult: Secondary | ICD-10-CM

## 2021-01-07 MED ORDER — ROSUVASTATIN CALCIUM 10 MG PO TABS: 10.0000 mg | ORAL_TABLET | Freq: Every day | ORAL | 2 refills | Status: DC

## 2021-01-07 MED ORDER — ROSUVASTATIN CALCIUM 10 MG PO TABS: 10.0000 mg | ORAL_TABLET | Freq: Every day | ORAL | 0 refills | Status: DC

## 2021-01-07 MED ORDER — OLMESARTAN MEDOXOMIL-HCTZ 40-12.5 MG PO TABS: 1.0000 | ORAL_TABLET | ORAL | 2 refills | Status: DC

## 2021-01-07 NOTE — Progress Notes (Signed)
Primary Physician/Referring:  Lorrene Reid, PA-C  Patient ID: Corey Martin, male    DOB: 04/23/1976, 45 y.o.   MRN: [id number]  Chief Complaint  Patient presents with   New Patient (Initial Visit)    Referred by Sueanne Margarita, DO   Hypertension   HPI:    Corey Martin  is a 45 y.o. African-American male patient with morbid obesity, bipolar disorder, hypertension, hypertlipidemia, family history of premature coronary artery disease in his father who had 2 myocardial infarctions and eventually had bypass surgery and stenting, who passed away recently with heart failure.  Since episode patient has had panic attacks and also chest discomfort and hence referral made to me.  Patient has returned to lifestyle.  Chest pain episodes occur at rest also with stressors work-related and also related stress.  He also has chronic dyspnea on exertion.    Past Medical History:  Diagnosis Date   Bipolar disorder ([place])    Constipation    Depression    bipolar depression   Hypercholesteremia    Hypertension    Joint pain    Knee pain    Sleep apnea    Past Surgical History:  Procedure Laterality Date   ADENOIDECTOMY     KNEE SURGERY     Family History  Problem Relation Age of Onset   Hyperlipidemia Mother    Hypertension Mother    Depression Mother    Anxiety disorder Mother    Obesity Mother    Heart attack Father    Hyperlipidemia Father    Hypertension Father    Obesity Father    Sleep apnea Father    Diabetes Maternal Grandmother     Social History   Tobacco Use   Smoking status: Never   Smokeless tobacco: Never  Substance Use Topics   Alcohol use: Yes    Alcohol/week: 3.0 standard drinks    Types: 1 Glasses of wine, 1 Cans of beer, 1 Shots of liquor per week    Comment: occ   Marital Status: Married  ROS  Review of Systems  Cardiovascular:  Positive for chest pain and dyspnea on exertion. Negative for leg swelling.  Gastrointestinal:  Negative for melena.   Psychiatric/Behavioral:  Positive for depression. The patient is nervous/anxious.   Objective  Blood pressure 130/83, pulse 72, temperature 98 F (36.7 C), resp. rate 16, height '5\' 10"'  (1.778 m), weight 294 lb (133.4 kg), SpO2 96 %. Body mass index is 42.18 kg/m.  Vitals with BMI 01/07/2021 12/11/2020 09/05/2019  Height '5\' 10"'  '5\' 10"'  '5\' 10"'   Weight 294 lbs 283 lbs 283 lbs  BMI 42.18 19.75 88.32  Systolic 549 826 415  Diastolic 83 92 85  Pulse 72 87 61     Physical Exam Constitutional:      Comments: Morbidly obese in no acute distress.  Neck:     Vascular: No carotid bruit or JVD.  Cardiovascular:     Rate and Rhythm: Normal rate and regular rhythm.     Pulses:          Carotid pulses are 2+ on the right side and 2+ on the left side.      Dorsalis pedis pulses are 2+ on the right side and 2+ on the left side.       Posterior tibial pulses are 2+ on the right side and 2+ on the left side.     Heart sounds: Normal heart sounds. No murmur heard.   No gallop.  Pulmonary:  Effort: Pulmonary effort is normal.     Breath sounds: Normal breath sounds.  Abdominal:     General: Bowel sounds are normal.     Palpations: Abdomen is soft.     Comments: Obese. Pannus present  Musculoskeletal:        General: No swelling.     Laboratory examination:   Lipid Panel     Component Value Date/Time   CHOL 181 08/30/2019 1006   TRIG 61 08/30/2019 1006   HDL 55 08/30/2019 1006   CHOLHDL 3.3 08/30/2019 1006   LDLCALC 114 (H) 08/30/2019 1006   LABVLDL 12 08/30/2019 1006     External labs:   Labs 09/14/2020:  Serum glucose 82 mg, BUN 10, creatinine 0.9, EGFR 92.5 mL, potassium 4.1, CMP otherwise normal.  Hb 15.2/HCT 43.0, platelets 258.  Normal indicis.  Labs 05/15/2020:  Total cholesterol 188, triglycerides 113, HDL 49, LDL 116.  Non-HDL cholesterol 139.  TSH normal.  A1c 4.9%.  Medications and allergies  No Known Allergies    Medication prior to this encounter:    Outpatient Medications Prior to Visit  Medication Sig Dispense Refill   buPROPion (WELLBUTRIN XL) 300 MG 24 hr tablet Take 1 tablet (300 mg total) by mouth daily. Needs office visit 30 tablet 0   Choline Fenofibrate (FENOFIBRIC ACID) 135 MG CPDR TAKE 1 CAPSULE BY MOUTH EVERY DAY 90 capsule 1   lamoTRIgine (LAMICTAL) 100 MG tablet Take 1 tablet (100 mg total) by mouth daily. 90 tablet 1   LORazepam (ATIVAN) 0.5 MG tablet Take 1 tablet (0.5 mg total) by mouth every 8 (eight) hours as needed for anxiety. 30 tablet 0   metFORMIN (GLUCOPHAGE) 500 MG tablet TAKE 1 TABLET (500 MG TOTAL) BY MOUTH 2 (TWO) TIMES DAILY WITH A MEAL. NO RF UNTIL OV 120 tablet 0   vitamin B-12 (CYANOCOBALAMIN) 1000 MCG tablet Take 1 tablet (1,000 mcg total) by mouth daily.     Vitamin D, Ergocalciferol, (DRISDOL) 1.25 MG (50000 UNIT) CAPS capsule Take 1 capsule (50,000 Units total) by mouth every 7 (seven) days. 12 capsule 1   lisinopril-hydrochlorothiazide (ZESTORETIC) 20-12.5 MG tablet Take 1 tablet by mouth daily. **PATIENT NEEDS APT FOR FURTHER REFILLS** 60 tablet 0   No facility-administered medications prior to visit.     FINAL MEDICATION AS OF TODAY:   Medications after current encounter Current Outpatient Medications  Medication Instructions   buPROPion (WELLBUTRIN XL) 300 mg, Oral, Daily, Needs office visit   Choline Fenofibrate (FENOFIBRIC ACID) 135 MG CPDR TAKE 1 CAPSULE BY MOUTH EVERY DAY   lamoTRIgine (LAMICTAL) 100 mg, Oral, Daily   LORazepam (ATIVAN) 0.5 mg, Oral, Every 8 hours PRN   metFORMIN (GLUCOPHAGE) 500 mg, Oral, 2 times daily with meals, No RF until OV   olmesartan-hydrochlorothiazide (BENICAR HCT) 40-12.5 MG tablet 1 tablet, Oral, BH-each morning   rosuvastatin (CRESTOR) 10 mg, Oral, Daily   vitamin B-12 (CYANOCOBALAMIN) 1,000 mcg, Oral, Daily   Vitamin D (Ergocalciferol) (DRISDOL) 50,000 Units, Oral, Every 7 days   Radiology:   No results found.  Cardiac Studies:   None EKG:      EKG 01/07/2021: Normal sinus rhythm at rate of 73 bpm, normal axis.  No evidence of ischemia, normal EKG.    Assessment     ICD-10-CM   1. Atypical chest pain  R07.89 EKG 12-Lead    CT CARDIAC SCORING ([facility] LOCATIONS ONLY)    PCV ECHOCARDIOGRAM COMPLETE    2. Primary hypertension  I10 olmesartan-hydrochlorothiazide (BENICAR HCT) 40-12.5  MG tablet    3. Hypercholesteremia  E78.00 Lipid Panel With LDL/HDL Ratio    Lipid Panel With LDL/HDL Ratio    rosuvastatin (CRESTOR) 10 MG tablet    DISCONTINUED: rosuvastatin (CRESTOR) 10 MG tablet    4. Class 3 severe obesity due to excess calories without serious comorbidity with body mass index (BMI) of 40.0 to 44.9 in adult (HCC)  E66.01    Z68.41     5. Family history of premature CAD  Z82.49       Medications Discontinued During This Encounter  Medication Reason   lisinopril-hydrochlorothiazide (ZESTORETIC) 20-12.5 MG tablet Change in therapy   rosuvastatin (CRESTOR) 10 MG tablet     Meds ordered this encounter  Medications   olmesartan-hydrochlorothiazide (BENICAR HCT) 40-12.5 MG tablet    Sig: Take 1 tablet by mouth every morning.    Dispense:  30 tablet    Refill:  2   DISCONTD: rosuvastatin (CRESTOR) 10 MG tablet    Sig: Take 1 tablet (10 mg total) by mouth daily.    Dispense:  30 tablet    Refill:  0   rosuvastatin (CRESTOR) 10 MG tablet    Sig: Take 1 tablet (10 mg total) by mouth daily.    Dispense:  30 tablet    Refill:  2    Sending refills also    Orders Placed This Encounter  Procedures   CT CARDIAC SCORING ([facility] LOCATIONS ONLY)    Standing Status:   Future    Standing Expiration Date:   03/09/2021    Order Specific Question:   Preferred imaging location?    Answer:   [facility]   Lipid Panel With LDL/HDL Ratio    Standing Status:   Future    Number of Occurrences:   1    Standing Expiration Date:   05/09/2021   EKG 12-Lead   PCV ECHOCARDIOGRAM COMPLETE    Standing Status:   Future    Standing Expiration  Date:   01/07/2022   Recommendations:   Corey Martin is a 45 y.o. African-American male patient with morbid obesity, bipolar disorder, hypertension, hypertriglyceridemia, family history of premature coronary artery disease in his paternal grandfather with MI at age 49 Y,  father who had 2 myocardial infarctions and eventually had bypass surgery and stenting in his 35s, who passed away recently with heart failure.  Patient's symptoms of chest pain with anxiety and also stress may indicate angina pectoris.  His cardiac exam and vascular examination is normal.  His risk factors include morbid obesity, hypertension, hyperlipidemia.  His blood pressure was elevated today as well and I reviewed external labs, LDL is still not at goal (<100).  We will discontinue lisinopril HCT and switch him to olmesartan HCT and start Crestor 10 mg daily check labs in 4 to 6 weeks.  Will obtain echocardiogram and also coronary calcium score and then decide whether he needs a nuclear stress test or a routine treadmill stress test depending upon his risks.  For now I encouraged him to continue to watch his diet and lose weight.   Adrian Prows, MD, [facility] 01/07/2021, 9:38 PM Office: [phone]

## 2021-01-08 ENCOUNTER — Ambulatory Visit: Payer: BC Managed Care – PPO | Admitting: Addiction (Substance Use Disorder)

## 2021-01-08 ENCOUNTER — Other Ambulatory Visit: Payer: Self-pay | Admitting: Cardiology

## 2021-01-08 DIAGNOSIS — I10 Essential (primary) hypertension: Secondary | ICD-10-CM

## 2021-01-08 DIAGNOSIS — F3181 Bipolar II disorder: Secondary | ICD-10-CM | POA: Diagnosis not present

## 2021-01-08 NOTE — Progress Notes (Signed)
      [facility] Counselor/Therapist Progress Note   Patient ID: Corey Martin, MRN: [id number],    Date: 01/08/2021  Time Spent:  Treatment Type: Individual Therapy  Reported Symptoms: stressed, sad, trying to catch up  Mental Status Exam:  Appearance:   Casual and Neat     Behavior:  Appropriate  Motor:  Normal  Speech/Language:   Normal Rate  Affect:  Appropriate and Congruent  Mood:  anxious, labile, and sad  Thought process:  normal  Thought content:    Rumination  Sensory/Perceptual disturbances:    WNL  Orientation:  x4  Attention:  Good  Concentration:  Good  Memory:  WNL  Fund of knowledge:   Good  Insight:    Fair  Judgment:   Fair  Impulse Control:  Good   Risk Assessment: Danger to Self:  No Self-injurious Behavior: No Danger to Others: No Duty to Warn:no Physical Aggression / Violence:No  Access to Firearms a concern: No  Gang Involvement:No   Subjective: Client reported having a lot of little encounters of grief over the last couple weeks. Client tearful and sad intermittently and trying to catch up on missing work over the weeks of grief. Client reported also having a good result from Brainspotting with the therapist last session. Client reported specifically that his brain was able to relax, and that his thoughts got cleaned up. Client looking to practice it today to process ongoing grief that gives him an urge to go eat emotionally (SUDS of 9/10). Therapist used MI & BSP with client to validate client's feelings and help client by remaining present and ride the wave of feelings. Client made progress identifying more of the feelings pushing him to eat and also recognizing the tie between automatic behaviors, and ways to disrupt those things that make it so easy for him to snack, that he could do it in his sleep. Client's SUDs also reduced to 2/10 emotionally, after processing using BSP. Therapist assesed for client MH stability but denied  SI/HI/AVH.  Interventions: Motivational Interviewing and Grief Therapy & BSP  Diagnosis:   ICD-10-CM   1. Bipolar II disorder (HCC)  F31.81        Plan of Care:  Client to return for weekly therapy with [name], therapist, to review again in 6 months.  Client to engage in positive self talk and challenging negative internal ruminations and self talk causing client to be overly anxious and worried using CBT, on daily practice. Client to engage in mindfulness: ie body scans each eveneing to help process and discharge emotional distress & recognize emotions. Client to utilize BSP (brainspotting) with therapist to help client regulate their anxiety in a somatic- felt body sense way: (ie by working to reduce muscle tension, ruminations, increased heart rate, constant worrying and feeling "hyper" ) by decreasing anxiety by 33% in the next 6 months.  Client to prioritize sleep 8+ hours each week night AEB going to bed by 10pm each night.  Client participated in the treatment planning of their therapy.   [name], LCSW, LCAS, CCTP, CCS-I, BSP

## 2021-01-28 ENCOUNTER — Ambulatory Visit
Admission: RE | Admit: 2021-01-28 | Discharge: 2021-01-28 | Disposition: A | Discharge status: Home / Self Care | Payer: BC Managed Care – PPO | Source: Ambulatory Visit | Attending: Cardiology | Admitting: Cardiology

## 2021-01-28 DIAGNOSIS — R0789 Other chest pain: Secondary | ICD-10-CM

## 2021-01-28 NOTE — Progress Notes (Signed)
Coronary calcium score 01/28/2021:  Total coronary calcium score of 0.  MeSA database percentile 0.  Normal ascending and descending thoracic aortic measurements.  Visualized noncardiac structures appear normal.

## 2021-01-29 ENCOUNTER — Other Ambulatory Visit: Payer: Self-pay

## 2021-01-29 ENCOUNTER — Ambulatory Visit: Payer: BC Managed Care – PPO | Admitting: Addiction (Substance Use Disorder)

## 2021-01-29 DIAGNOSIS — F4321 Adjustment disorder with depressed mood: Secondary | ICD-10-CM | POA: Diagnosis not present

## 2021-01-29 NOTE — Progress Notes (Signed)
      [facility] Counselor/Therapist Progress Note   Patient ID: Ilan Kahrs, MRN: [id number],    Date: 01/29/2021  Time Spent:  Treatment Type: Individual Therapy  Reported Symptoms: hurting, upset, conflicted  Mental Status Exam:  Appearance:   Casual and Neat     Behavior:  Appropriate  Motor:  Normal  Speech/Language:   Normal Rate  Affect:  Appropriate and Congruent  Mood:  angry and sad  Thought process:  normal  Thought content:    Rumination  Sensory/Perceptual disturbances:    WNL  Orientation:  x4  Attention:  Good  Concentration:  Good  Memory:  WNL  Fund of knowledge:   Good  Insight:    Fair  Judgment:   Fair  Impulse Control:  Good   Risk Assessment: Danger to Self:  No Self-injurious Behavior: No Danger to Others: No Duty to Warn:no Physical Aggression / Violence:No  Access to Firearms a concern: No  Gang Involvement:No   Subjective: Client reported his grief is compounded by the spiraling health of his wife's father and mother. Client thinking of how he can help his wife and reduce stress by getting her father the help he needs. Client considering ways to make sure he gets nursing assistance and processed the conflicted feelings he has aobut how to go about that. Therapist used MI & CBT with client to support him as he processed his feelings and thoguhts about the stressful family situation. Therapist also used SFT with client as he discussed his goals for his family and they came up with some specific 1st steps he could begin to take to alleviate the worry he and his family are feeling about his father in law. Therapist assesed for client MH stability but client denied SI/HI/AVH.  Interventions: Motivational Interviewing, Solution-Oriented/Positive Psychology, and Grief Therapy   Diagnosis:   ICD-10-CM   1. Grief reaction  F43.21       Plan of Care:  Client to return for weekly therapy with [name], therapist, to review again in 6  months.  Client to engage in positive self talk and challenging negative internal ruminations and self talk causing client to be overly anxious and worried using CBT, on daily practice. Client to engage in mindfulness: ie body scans each eveneing to help process and discharge emotional distress & recognize emotions. Client to utilize BSP (brainspotting) with therapist to help client regulate their anxiety in a somatic- felt body sense way: (ie by working to reduce muscle tension, ruminations, increased heart rate, constant worrying and feeling "hyper" ) by decreasing anxiety by 33% in the next 6 months.  Client to prioritize sleep 8+ hours each week night AEB going to bed by 10pm each night.  Client participated in the treatment planning of their therapy.   [name], LCSW, LCAS, CCTP, CCS-I, BSP

## 2021-03-03 ENCOUNTER — Ambulatory Visit: Payer: BC Managed Care – PPO

## 2021-03-03 ENCOUNTER — Other Ambulatory Visit: Payer: Self-pay

## 2021-03-03 DIAGNOSIS — R0789 Other chest pain: Secondary | ICD-10-CM

## 2021-03-17 ENCOUNTER — Other Ambulatory Visit: Payer: Self-pay

## 2021-03-17 ENCOUNTER — Ambulatory Visit: Payer: BC Managed Care – PPO | Admitting: Behavioral Health

## 2021-03-17 ENCOUNTER — Encounter: Payer: Self-pay | Admitting: Behavioral Health

## 2021-03-17 ENCOUNTER — Ambulatory Visit: Payer: BC Managed Care – PPO | Admitting: Addiction (Substance Use Disorder)

## 2021-03-17 DIAGNOSIS — F4321 Adjustment disorder with depressed mood: Secondary | ICD-10-CM

## 2021-03-17 DIAGNOSIS — F411 Generalized anxiety disorder: Secondary | ICD-10-CM

## 2021-03-17 DIAGNOSIS — F3181 Bipolar II disorder: Secondary | ICD-10-CM

## 2021-03-17 NOTE — Progress Notes (Signed)
      [facility] Counselor/Therapist Progress Note   Patient ID: Corey Martin, MRN: [id number],    Date: 03/17/2021  Time Spent:  Treatment Type: Individual Therapy  Reported Symptoms: worried   Mental Status Exam:  Appearance:   Casual and Neat     Behavior:  Appropriate  Motor:  Normal  Speech/Language:   Normal Rate  Affect:  Appropriate and Congruent  Mood:  anxious  Thought process:  normal  Thought content:    Rumination  Sensory/Perceptual disturbances:    WNL  Orientation:  x4  Attention:  Good  Concentration:  Good  Memory:  WNL  Fund of knowledge:   Good  Insight:    Good  Judgment:   Good  Impulse Control:  Good   Risk Assessment: Danger to Self:  No Self-injurious Behavior: No Danger to Others: No Duty to Warn:no Physical Aggression / Violence:No  Access to Firearms a concern: No  Gang Involvement:No   Subjective: Client reported struggling with some difficult things with work that bring up a lot of emotion and trigger feelings he is dealing with. Client processed the ongoing grief he is feeling related to his dad and the pain he has intermittently when he thinks of his dad. Client reported it is mostly a visceral thing that his thoughts cant calm down and therapist normalized (MI) and used mindfulness to help client emotionally regulate, ground, and remember ways to use his gaze to slow his heartbeat and calm his feelings. Client also talked about a situation with a employee that needed accountability to help him and identified with that, also stating his need for accountability and coaching on helping him to lose weight (SFT).  Client reported his anxiety about dying due to him being overweight has caused him many sleepless nights and are something that are motivating him to take a next step to a nutritionist and doctor again. Client set a goal of going to [facility] for support with weight management (RPT/SFT). Therapist assesed for client MH  stability but client denied SI/HI/AVH.  Interventions: Mindfulness Meditation, Motivational Interviewing, Solution-Oriented/Positive Psychology, and Grief Therapy & RPT  Diagnosis:   ICD-10-CM   1. Generalized anxiety disorder  F41.1     2. Grief reaction  F43.21        Plan of Care:  Client to return for weekly therapy with [name], therapist, to review again in 6 months.  Client to engage in positive self talk and challenging negative internal ruminations and self talk causing client to be overly anxious and worried using CBT, on daily practice. Client to engage in mindfulness: ie body scans each eveneing to help process and discharge emotional distress & recognize emotions. Client to utilize BSP (brainspotting) with therapist to help client regulate their anxiety in a somatic- felt body sense way: (ie by working to reduce muscle tension, ruminations, increased heart rate, constant worrying and feeling "hyper" ) by decreasing anxiety by 33% in the next 6 months.  Client to prioritize sleep 8+ hours each week night AEB going to bed by 10pm each night.  Client participated in the treatment planning of their therapy.   [name], LCSW, LCAS, CCTP, CCS-I, BSP

## 2021-03-17 NOTE — Progress Notes (Signed)
[facility] Med Check  Patient ID: Corey Martin,  MRN: [id number]  PCP: Mayer Masker, PA-C  Date of Evaluation: 03/17/2021 Time spent:30 minutes  Chief Complaint:  Chief Complaint   Anxiety; Depression; Grief; Follow-up; Patient Education     HISTORY/CURRENT STATUS: HPI 45 year old presents to this office for follow up and for medication management.He says that he is "doing ok". Says that he feels he has made progress since the passing of his father but not there 100%. He continue to see [name] for therapy and he believes that this is where he will have to deal with most of his problems. He feels medications only go so far and does not want to adjust at this time. Says his anxiety level today is 4/10 and depression at 3/10. Says he is sleeping 7-8 hours per night. No mania, No psychosis, No SI/HI.  No prior psychiatric medication failures noted     Individual Medical History/ Review of Systems: Changes? :No   Allergies: Patient has no known allergies.  Current Medications:  Current Outpatient Medications:    buPROPion (WELLBUTRIN XL) 300 MG 24 hr tablet, Take 1 tablet (300 mg total) by mouth daily. Needs office visit, Disp: 30 tablet, Rfl: 0   Choline Fenofibrate (FENOFIBRIC ACID) 135 MG CPDR, TAKE 1 CAPSULE BY MOUTH EVERY DAY, Disp: 90 capsule, Rfl: 1   lamoTRIgine (LAMICTAL) 100 MG tablet, Take 1 tablet (100 mg total) by mouth daily., Disp: 90 tablet, Rfl: 1   LORazepam (ATIVAN) 0.5 MG tablet, Take 1 tablet (0.5 mg total) by mouth every 8 (eight) hours as needed for anxiety., Disp: 30 tablet, Rfl: 0   metFORMIN (GLUCOPHAGE) 500 MG tablet, TAKE 1 TABLET (500 MG TOTAL) BY MOUTH 2 (TWO) TIMES DAILY WITH A MEAL. NO RF UNTIL OV, Disp: 120 tablet, Rfl: 0   olmesartan-hydrochlorothiazide (BENICAR HCT) 40-12.5 MG tablet, TAKE 1 TABLET BY MOUTH EVERY DAY IN THE MORNING, Disp: 90 tablet, Rfl: 3   vitamin B-12 (CYANOCOBALAMIN) 1000 MCG tablet, Take 1 tablet (1,000 mcg total) by  mouth daily., Disp:  , Rfl:    Vitamin D, Ergocalciferol, (DRISDOL) 1.25 MG (50000 UNIT) CAPS capsule, Take 1 capsule (50,000 Units total) by mouth every 7 (seven) days., Disp: 12 capsule, Rfl: 1   rosuvastatin (CRESTOR) 10 MG tablet, Take 1 tablet (10 mg total) by mouth daily., Disp: 30 tablet, Rfl: 2 Medication Side Effects: none  Family Medical/ Social History: Changes? No  MENTAL HEALTH EXAM:  There were no vitals taken for this visit.There is no height or weight on file to calculate BMI.  General Appearance: Casual, Neat, and Well Groomed  Eye Contact:  Good  Speech:  Clear and Coherent  Volume:  Normal  Mood:  NA and Depressed  Affect:  Appropriate  Thought Process:  Coherent  Orientation:  Full (Time, Place, and Person)  Thought Content: Logical   Suicidal Thoughts:  No  Homicidal Thoughts:  No  Memory:  WNL  Judgement:  Good  Insight:  Good  Psychomotor Activity:  Normal  Concentration:  Concentration: Good  Recall:  Good  Fund of Knowledge: Good  Language: Good  Assets:  Desire for Improvement  ADL's:  Intact  Cognition: WNL  Prognosis:  Good    DIAGNOSES:    ICD-10-CM   1. Grief reaction  F43.21     2. Bipolar II disorder (HCC)  F31.81     3. Generalized anxiety disorder  F41.1       Receiving Psychotherapy: Yes  [name]  RECOMMENDATIONS:   Continue on Lamictal 100 mg daily Continue on Wellbutrin 300 mg daily Continue on 0.5 Ativan 3 times daily PRN Will report any worsening symptoms or changes promplty Continue therapy with Kayla Cain/ Address Grief next visit Greater than 50% of 30 min. face to face time with patient was spent on counseling and coordination of care. We discussed how he continues to cope with loss of his father. He is also struggling with gaining weight back that he had managed to lose previously. He wants to find a weight loss organization that takes his insurance.  Reviewed medication regimen and refilled current medications.   Suggested patient continue with cardiology. Educated patient on use of statin drugs, SE, and research pertaining to.       Joan Flores, NP

## 2021-03-18 DIAGNOSIS — L0291 Cutaneous abscess, unspecified: Secondary | ICD-10-CM | POA: Diagnosis not present

## 2021-04-07 ENCOUNTER — Ambulatory Visit: Payer: BC Managed Care – PPO | Admitting: Addiction (Substance Use Disorder)

## 2021-04-07 ENCOUNTER — Other Ambulatory Visit: Payer: Self-pay

## 2021-04-07 DIAGNOSIS — F4321 Adjustment disorder with depressed mood: Secondary | ICD-10-CM | POA: Diagnosis not present

## 2021-04-07 DIAGNOSIS — F3181 Bipolar II disorder: Secondary | ICD-10-CM | POA: Diagnosis not present

## 2021-04-07 NOTE — Progress Notes (Signed)
      [facility] Counselor/Therapist Progress Note   Patient ID: Corey Martin, MRN: [id number],    Date: 04/07/2021  Time Spent:  Treatment Type: Individual Therapy  Reported Symptoms: heavy-hearted  Mental Status Exam:  Appearance:   Casual and Neat     Behavior:  Appropriate  Motor:  Normal  Speech/Language:   Normal Rate  Affect:  Appropriate and Congruent  Mood:  sad  Thought process:  normal  Thought content:    Rumination  Sensory/Perceptual disturbances:    WNL  Orientation:  x4  Attention:  Good  Concentration:  Good  Memory:  WNL  Fund of knowledge:   Good  Insight:    Good  Judgment:   Good  Impulse Control:  Good   Risk Assessment: Danger to Self:  No Self-injurious Behavior: No Danger to Others: No Duty to Warn:no Physical Aggression / Violence:No  Access to Firearms a concern: No  Gang Involvement:No   Subjective: Client reported feeling very heavy hearted 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 Client's grief being brought back up each time, reminding him of the pain of losing his dead. Client was tearful and felt a way to go become emotional and find relief from the heavyheart. Client and therapist processed his grief further using grief therapy. Therapist normalized (MI) and used mindfulness to help client emotionally regulate, ground, and hold hope for continued healing. 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 Client also discussed how these affect his MH & therapist encouraged the use of anchors and coping skills to help him process. Therapist assesed for client MH stability but client denied SI/HI/AVH.  Interventions: Cognitive Behavioral Therapy, Motivational Interviewing, and Grief Therapy & RPT  Diagnosis:   ICD-10-CM   1. Bipolar II disorder (HCC)   F31.81     2. Grief reaction  F43.21       Plan of Care:  Client to return for weekly therapy with [name], therapist, to review again in 6 months.  Client to engage in positive self talk and challenging negative internal ruminations and self talk causing client to be overly anxious and worried using CBT, on daily practice. Client to engage in mindfulness: ie body scans each eveneing to help process and discharge emotional distress & recognize emotions. Client to utilize BSP (brainspotting) with therapist to help client regulate their anxiety in a somatic- felt body sense way: (ie by working to reduce muscle tension, ruminations, increased heart rate, constant worrying and feeling "hyper" ) by decreasing anxiety by 33% in the next 6 months.  Client to prioritize sleep 8+ hours each week night AEB going to bed by 10pm each night.  Client participated in the treatment planning of their therapy.   [name], LCSW, LCAS, CCTP, CCS-I, BSP

## 2021-04-15 ENCOUNTER — Encounter: Payer: Self-pay | Admitting: Cardiology

## 2021-04-15 ENCOUNTER — Ambulatory Visit: Payer: BC Managed Care – PPO | Admitting: Cardiology

## 2021-04-15 ENCOUNTER — Other Ambulatory Visit: Payer: Self-pay

## 2021-04-15 VITALS — BP 138/85 | HR 80 | Temp 97.9°F | Resp 17 | Ht 70.0 in | Wt 302.2 lb

## 2021-04-15 DIAGNOSIS — I10 Essential (primary) hypertension: Secondary | ICD-10-CM

## 2021-04-15 DIAGNOSIS — Z8249 Family history of ischemic heart disease and other diseases of the circulatory system: Secondary | ICD-10-CM

## 2021-04-15 DIAGNOSIS — E7800 Pure hypercholesterolemia, unspecified: Secondary | ICD-10-CM | POA: Diagnosis not present

## 2021-04-15 NOTE — Progress Notes (Signed)
Primary Physician/Referring:  Sueanne Margarita, DO  Patient ID: Corey Martin, male    DOB: March 15, 1976, 45 y.o.   MRN: [id number]  Chief Complaint  Patient presents with   Follow-up    3 month   HPI:    Corey Martin  is a 45 y.o. male patient with morbid obesity, bipolar disorder, hypertension, hypertlipidemia, family history of premature coronary artery disease in his father who had 2 myocardial infarctions and eventually had bypass surgery and stenting, who passed away recently with heart failure.  Patient presents for 41-monthfollow-up.  Last office visit switch patient from lisinopril/hydrochlorothiazide to olmesartan/hydrochlorothiazide and added Crestor 10 mg daily.  Also obtained echocardiogram which revealed LVEF 55 to 60% with moderate LVH, otherwise no significant abnormality.  He also underwent coronary calcium scoring with score of 0.  Patient is tolerating present medications without issue.  He has had no recurrence of chest pain since last office visit.  He is motivated to increase physical activity and focus on weight loss.  He is considering enrolling in medical weight management program.  Patient's blood pressure is mildly elevated in the office today, however he states it is typically well controlled on readings outside the office.  Denies chest pain, dyspnea, palpitations, syncope, near syncope.  Denies orthopnea, PND, leg swelling.   Past Medical History:  Diagnosis Date   Bipolar disorder ([place]    Constipation    Depression    bipolar depression   Hypercholesteremia    Hypertension    Joint pain    Knee pain    Sleep apnea    Past Surgical History:  Procedure Laterality Date   ADENOIDECTOMY     KNEE SURGERY     Family History  Problem Relation Age of Onset   Hyperlipidemia Mother    Hypertension Mother    Depression Mother    Anxiety disorder Mother    Obesity Mother    Heart attack Father    Hyperlipidemia Father    Hypertension Father     Obesity Father    Sleep apnea Father    Diabetes Maternal Grandmother     Social History   Tobacco Use   Smoking status: Never   Smokeless tobacco: Never  Substance Use Topics   Alcohol use: Yes    Alcohol/week: 3.0 standard drinks    Types: 1 Glasses of wine, 1 Cans of beer, 1 Shots of liquor per week    Comment: occ   Marital Status: Married  ROS  Review of Systems  Constitutional: Negative for malaise/fatigue and weight gain.  Cardiovascular:  Negative for chest pain (improved), claudication, dyspnea on exertion (improved), leg swelling, near-syncope, orthopnea, palpitations, paroxysmal nocturnal dyspnea and syncope.  Respiratory:  Negative for shortness of breath.   Gastrointestinal:  Negative for melena.  Neurological:  Negative for dizziness.  Psychiatric/Behavioral:  Positive for depression. The patient is nervous/anxious.   Objective  Blood pressure 138/85, pulse 80, temperature 97.9 F (36.6 C), temperature source Temporal, resp. rate 17, height '5\' 10"'  (1.778 m), weight (!) 302 lb 3.2 oz (137.1 kg), SpO2 97 %. Body mass index is 43.36 kg/m.  Vitals with BMI 04/15/2021 01/07/2021 09/05/2019  Height '5\' 10"'  '5\' 10"'  '5\' 10"'   Weight 302 lbs 3 oz 294 lbs 283 lbs  BMI 43.36 [id number] Systolic [id number] Diastolic 85 83 85  Pulse 80 72 61  Some encounter information is confidential and restricted. Go to Review Flowsheets activity to see all data.  Physical Exam Vitals reviewed.  Constitutional:      Appearance: He is obese.     Comments: Morbidly obese in no acute distress.  Neck:     Vascular: No carotid bruit or JVD.  Cardiovascular:     Rate and Rhythm: Normal rate and regular rhythm.     Pulses:          Carotid pulses are 2+ on the right side and 2+ on the left side.      Dorsalis pedis pulses are 2+ on the right side and 2+ on the left side.       Posterior tibial pulses are 2+ on the right side and 2+ on the left side.     Heart sounds: Normal heart  sounds. No murmur heard.   No gallop.  Pulmonary:     Effort: Pulmonary effort is normal.     Breath sounds: Normal breath sounds.  Abdominal:     Comments: Obese. Pannus present  Musculoskeletal:     Right lower leg: No edema.     Left lower leg: No edema.     Laboratory examination:   Lipid Panel     Component Value Date/Time   CHOL 181 08/30/2019 1006   TRIG 61 08/30/2019 1006   HDL 55 08/30/2019 1006   CHOLHDL 3.3 08/30/2019 1006   LDLCALC 114 (H) 08/30/2019 1006   LABVLDL 12 08/30/2019 1006     External labs:   Labs 09/14/2020:  Serum glucose 82 mg, BUN 10, creatinine 0.9, EGFR 92.5 mL, potassium 4.1, CMP otherwise normal.  Hb 15.2/HCT 43.0, platelets 258.  Normal indicis.  Labs 05/15/2020:  Total cholesterol 188, triglycerides 113, HDL 49, LDL 116.  Non-HDL cholesterol 139.  TSH normal.  A1c 4.9%.  Medications and allergies  No Known Allergies    Medication prior to this encounter:   Outpatient Medications Prior to Visit  Medication Sig Dispense Refill   buPROPion (WELLBUTRIN XL) 300 MG 24 hr tablet Take 1 tablet (300 mg total) by mouth daily. Needs office visit 30 tablet 0   Choline Fenofibrate (FENOFIBRIC ACID) 135 MG CPDR TAKE 1 CAPSULE BY MOUTH EVERY DAY 90 capsule 1   lamoTRIgine (LAMICTAL) 100 MG tablet Take 1 tablet (100 mg total) by mouth daily. 90 tablet 1   LORazepam (ATIVAN) 0.5 MG tablet Take 1 tablet (0.5 mg total) by mouth every 8 (eight) hours as needed for anxiety. 30 tablet 0   metFORMIN (GLUCOPHAGE) 500 MG tablet TAKE 1 TABLET (500 MG TOTAL) BY MOUTH 2 (TWO) TIMES DAILY WITH A MEAL. NO RF UNTIL OV 120 tablet 0   olmesartan-hydrochlorothiazide (BENICAR HCT) 40-12.5 MG tablet TAKE 1 TABLET BY MOUTH EVERY DAY IN THE MORNING 90 tablet 3   rosuvastatin (CRESTOR) 10 MG tablet Take 1 tablet (10 mg total) by mouth daily. 30 tablet 2   vitamin B-12 (CYANOCOBALAMIN) 1000 MCG tablet Take 1 tablet (1,000 mcg total) by mouth daily.     Vitamin D,  Ergocalciferol, (DRISDOL) 1.25 MG (50000 UNIT) CAPS capsule Take 1 capsule (50,000 Units total) by mouth every 7 (seven) days. 12 capsule 1   No facility-administered medications prior to visit.     FINAL MEDICATION AS OF TODAY:   Medications after current encounter Current Outpatient Medications  Medication Instructions   buPROPion (WELLBUTRIN XL) 300 mg, Oral, Daily, Needs office visit   Choline Fenofibrate (FENOFIBRIC ACID) 135 MG CPDR TAKE 1 CAPSULE BY MOUTH EVERY DAY   lamoTRIgine (LAMICTAL) 100 mg, Oral, Daily  LORazepam (ATIVAN) 0.5 mg, Oral, Every 8 hours PRN   metFORMIN (GLUCOPHAGE) 500 mg, Oral, 2 times daily with meals, No RF until OV   olmesartan-hydrochlorothiazide (BENICAR HCT) 40-12.5 MG tablet TAKE 1 TABLET BY MOUTH EVERY DAY IN THE MORNING   rosuvastatin (CRESTOR) 10 mg, Oral, Daily   vitamin B-12 (CYANOCOBALAMIN) 1,000 mcg, Oral, Daily   Vitamin D (Ergocalciferol) (DRISDOL) 50,000 Units, Oral, Every 7 days   Radiology:   No results found.  Cardiac Studies:   PCV ECHOCARDIOGRAM COMPLETE 41/42/3953 Normal LV systolic function with visual EF 55-60%. Left ventricle cavity is normal in size. Moderate left ventricular hypertrophy. Normal global wall motion. Normal diastolic filling pattern, normal LAP. IVC is dilated with a respiratory response of <50%. No significant valvular heart disease. No prior study for comparison.   Coronary calcium score 01/28/2021:  Total coronary calcium score of 0.  MeSA database percentile 0.  Normal ascending and descending thoracic aortic measurements.  Visualized noncardiac structures appear normal.  EKG:   01/07/2021: Normal sinus rhythm at rate of 73 bpm, normal axis.  No evidence of ischemia, normal EKG.    Assessment     ICD-10-CM   1. Primary hypertension  I10     2. Hypercholesteremia  E78.00     3. Family history of premature CAD  Z82.49        There are no discontinued medications.   No orders of the defined types  were placed in this encounter.   No orders of the defined types were placed in this encounter.  Recommendations:   Corey Martin is a 45 y.o. male patient with morbid obesity, bipolar disorder, hypertension, hypertriglyceridemia, family history of premature coronary artery disease in his paternal grandfather with MI at age 36 Y,  father who had 2 myocardial infarctions and eventually had bypass surgery and stenting in his 93s, who passed away recently with heart failure.  Patient presents for 44-monthfollow-up.  Last office visit switch patient from lisinopril/hydrochlorothiazide to olmesartan/hydrochlorothiazide and added Crestor 10 mg daily.  Also obtained echocardiogram which revealed LVEF 55 to 60% with moderate LVH, otherwise no significant abnormality.  He also underwent coronary calcium scoring with score of 0.  Reviewed and discussed with patient results of echocardiogram and coronary calcium scoring, details above.  Do not recommend further cardiovascular work-up, but rather primary prevention.  Patient is tolerating Crestor without issue, however has not done repeat lipid profile testing.  Advised patient to have repeat lipid profile done.  Patient's blood pressure is mildly elevated in the office, however he states it is typically well controlled outside of the office.  Advised patient to obtain blood pressure cuff and monitor blood pressure at home on a regular basis, he will notify PCP if blood pressure is >130/80 mmHg.  Will defer further management of hypertension and hyperlipidemia as well as primary cardiovascular prevention to PCP.  Encourage patient to continue to focus on healthy diet and lifestyle modifications specifically weight loss.  Follow-up as needed.   CAlethia Berthold PA-C 04/15/2021, 3:18 PM Office: [phone]

## 2021-05-05 ENCOUNTER — Ambulatory Visit: Payer: BC Managed Care – PPO | Admitting: Addiction (Substance Use Disorder)

## 2021-06-09 ENCOUNTER — Ambulatory Visit: Payer: BC Managed Care – PPO | Admitting: Addiction (Substance Use Disorder)

## 2021-06-09 ENCOUNTER — Other Ambulatory Visit: Payer: Self-pay

## 2021-06-09 DIAGNOSIS — F3181 Bipolar II disorder: Secondary | ICD-10-CM

## 2021-06-09 NOTE — Progress Notes (Signed)
      [facility] Counselor/Therapist Progress Note   Patient ID: Corey Martin, MRN: [id number],    Date: 06/09/2021  Time Spent:  Treatment Type: Individual Therapy  Reported Symptoms: positive   Mental Status Exam:  Appearance:   Casual and Neat     Behavior:  Appropriate  Motor:  Normal  Speech/Language:   Normal Rate  Affect:  Appropriate and Congruent  Mood:  sad  Thought process:  normal  Thought content:    Rumination  Sensory/Perceptual disturbances:    WNL  Orientation:  x4  Attention:  Good  Concentration:  Good  Memory:  WNL  Fund of knowledge:   Good  Insight:    Good  Judgment:   Good  Impulse Control:  Good   Risk Assessment: Danger to Self:  No Self-injurious Behavior: No Danger to Others: No Duty to Warn:no Physical Aggression / Violence:No  Access to Firearms a concern: No  Gang Involvement:No   Subjective: Client reported doing much better overall but just really stressed. Client reported having a lot of missing staff causing extra workload for him at church. This has kept client busy but client also refueling himself and learning more about himself. Client identified that at a conference from his [employer] that really fed him and refueled him. Client identified what he learned about 5 ways to strengthen his heart: 1. To do things to tend to his heart health physically & to get accountability in that. 2. Finding a better rhythm for life- adjusting to have a work life balance & setting a bedtime for 12:30. 3. Investing himself in things that do his heart good- spending time and money on things that are edifying. 4. Sit at the feet of jesus & listen to the heart of God- working on his prayer life.5. Be of good courage for the changing church. Client articulated his cognitive growth and discernment and therapist used MI and CBT to validate client's progress and process his cognitive dissonance. 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 However, client reported increased stress around the holidays. Therapist assesed for client MH stability but client denied SI/HI/AVH.  Interventions: Cognitive Behavioral Therapy, Motivational Interviewing, and Grief Therapy & RPT  Diagnosis:   ICD-10-CM   1. Bipolar II disorder (HCC)  F31.81       Plan of Care:  Client to return for weekly therapy with [name], therapist, to review again in 6 months.  Client to engage in positive self talk and challenging negative internal ruminations and self talk causing client to be overly anxious and worried using CBT, on daily practice. Client to engage in mindfulness: ie body scans each eveneing to help process and discharge emotional distress & recognize emotions. Client to utilize BSP (brainspotting) with therapist to help client regulate their anxiety in a somatic- felt body sense way: (ie by working to reduce muscle tension, ruminations, increased heart rate, constant worrying and feeling "hyper" ) by decreasing anxiety by 33% in the next 6 months.  Client to prioritize sleep 8+ hours each week night AEB going to bed by 10pm each night.  Client participated in the treatment planning of their therapy.   [name], LCSW, LCAS, CCTP, CCS-I, BSP

## 2021-07-13 DIAGNOSIS — E559 Vitamin D deficiency, unspecified: Secondary | ICD-10-CM | POA: Diagnosis not present

## 2021-07-13 DIAGNOSIS — Z125 Encounter for screening for malignant neoplasm of prostate: Secondary | ICD-10-CM | POA: Diagnosis not present

## 2021-07-13 DIAGNOSIS — E538 Deficiency of other specified B group vitamins: Secondary | ICD-10-CM | POA: Diagnosis not present

## 2021-07-13 DIAGNOSIS — E785 Hyperlipidemia, unspecified: Secondary | ICD-10-CM | POA: Diagnosis not present

## 2021-07-13 DIAGNOSIS — R7303 Prediabetes: Secondary | ICD-10-CM | POA: Diagnosis not present

## 2021-07-14 ENCOUNTER — Ambulatory Visit: Payer: BC Managed Care – PPO | Admitting: Addiction (Substance Use Disorder)

## 2021-07-21 ENCOUNTER — Other Ambulatory Visit: Payer: Self-pay | Admitting: Behavioral Health

## 2021-07-21 DIAGNOSIS — F411 Generalized anxiety disorder: Secondary | ICD-10-CM

## 2021-07-22 NOTE — Telephone Encounter (Signed)
Pt is in [place] and needs [facility] [address]. Phone numnber [phone].

## 2021-07-23 NOTE — Telephone Encounter (Signed)
Need to change pharmacy.

## 2021-07-23 NOTE — Telephone Encounter (Signed)
Sent to the requested pharmacy. See phone message below:  Pt is in [place] and needs [facility] at [address] [place]. Phone numnber [phone].

## 2021-07-23 NOTE — Telephone Encounter (Signed)
It's giving me a pharmacy error message when I try to send it.

## 2021-07-24 ENCOUNTER — Other Ambulatory Visit: Payer: Self-pay | Admitting: Behavioral Health

## 2021-07-24 ENCOUNTER — Other Ambulatory Visit: Payer: Self-pay

## 2021-07-24 DIAGNOSIS — F411 Generalized anxiety disorder: Secondary | ICD-10-CM

## 2021-07-24 DIAGNOSIS — F3181 Bipolar II disorder: Secondary | ICD-10-CM

## 2021-07-24 MED ORDER — LORAZEPAM 0.5 MG PO TABS: 0.5000 mg | ORAL_TABLET | Freq: Three times a day (TID) | ORAL | 0 refills | Status: DC | PRN

## 2021-07-27 ENCOUNTER — Other Ambulatory Visit: Payer: Self-pay | Admitting: Cardiology

## 2021-07-27 DIAGNOSIS — E7800 Pure hypercholesterolemia, unspecified: Secondary | ICD-10-CM

## 2021-07-29 ENCOUNTER — Other Ambulatory Visit (HOSPITAL_COMMUNITY): Payer: Self-pay

## 2021-07-29 DIAGNOSIS — Z1331 Encounter for screening for depression: Secondary | ICD-10-CM | POA: Diagnosis not present

## 2021-07-29 DIAGNOSIS — I10 Essential (primary) hypertension: Secondary | ICD-10-CM | POA: Diagnosis not present

## 2021-07-29 DIAGNOSIS — Z0000 Encounter for general adult medical examination without abnormal findings: Secondary | ICD-10-CM | POA: Diagnosis not present

## 2021-07-29 DIAGNOSIS — Z1339 Encounter for screening examination for other mental health and behavioral disorders: Secondary | ICD-10-CM | POA: Diagnosis not present

## 2021-07-29 MED ORDER — INSULIN PEN NEEDLE 31G X 8 MM MISC: 3 refills | Status: AC

## 2021-07-29 MED ORDER — OZEMPIC (0.25 OR 0.5 MG/DOSE) 2 MG/3ML ~~LOC~~ SOPN
PEN_INJECTOR | SUBCUTANEOUS | 0 refills | Status: AC
  Filled 2021-07-29 – 2022-01-05 (×3): qty 3, 42d supply, fill #0
  Filled 2022-01-08: qty 3, 28d supply, fill #0

## 2021-08-06 ENCOUNTER — Other Ambulatory Visit (HOSPITAL_COMMUNITY): Payer: Self-pay

## 2021-08-18 ENCOUNTER — Other Ambulatory Visit: Payer: Self-pay

## 2021-08-18 ENCOUNTER — Ambulatory Visit (INDEPENDENT_AMBULATORY_CARE_PROVIDER_SITE_OTHER): Payer: BC Managed Care – PPO | Admitting: Addiction (Substance Use Disorder)

## 2021-08-18 DIAGNOSIS — F3181 Bipolar II disorder: Secondary | ICD-10-CM

## 2021-08-18 NOTE — Progress Notes (Signed)
°      [facility] Counselor/Therapist Progress Note   Patient ID: Iniko Robles, MRN: [id number],    Date: 08/18/2021  Time Spent:  Treatment Type: Individual Therapy  Reported Symptoms: really down, stressed, etc.   Mental Status Exam:  Appearance:   Casual and Neat     Behavior:  Appropriate  Motor:  Normal  Speech/Language:   Normal Rate  Affect:  Appropriate and Congruent  Mood:  anxious, irritable, and labile  Thought process:  normal  Thought content:    Rumination  Sensory/Perceptual disturbances:    WNL  Orientation:  x4  Attention:  Good  Concentration:  Good  Memory:  WNL  Fund of knowledge:   Good  Insight:    Good  Judgment:   Good  Impulse Control:  Good   Risk Assessment: Danger to Self:  No Self-injurious Behavior: No Danger to Others: No Duty to Warn:no Physical Aggression / Violence:No  Access to Firearms a concern: No  Gang Involvement:No   Subjective: Client reported the triggers for his stress and why it was amplified by forgetting to take his Wellbutrin for 2 solid weeks. Client was unable to realize why he felt like a raging teenager until he remembered he forgot the med and was upset his wife didn't remind him to take his meds or anything. Client feeling labile and sad that his daughter is getting scared each time her parents have a disagreement. Client made progress by taking the blame for his behavior that caused his daughter anxiety. Therapist used MI and roleplay to help client feel validated for his struggle while also helping him articulate what happened: that he forgot to take his meds, and how that affected his behavior. Therapist supported client as he processed his feelings and thoughts, and therapist assessed for client MH stability but client denied SI/HI/AVH.  Interventions: Cognitive Behavioral Therapy, Roleplay, and Motivational Interviewing & RPT  Diagnosis:   ICD-10-CM   1. Bipolar II disorder (HCC)  F31.81      Plan  of Care:  Client to return for weekly therapy with [name], therapist, to review again in 6 months.  Client to engage in positive self talk and challenging negative internal ruminations and self talk causing client to be overly anxious and worried using CBT, on daily practice. Client to engage in mindfulness: ie body scans each eveneing to help process and discharge emotional distress & recognize emotions. Client to utilize BSP (brainspotting) with therapist to help client regulate their anxiety in a somatic- felt body sense way: (ie by working to reduce muscle tension, ruminations, increased heart rate, constant worrying and feeling "hyper" ) by decreasing anxiety by 33% in the next 6 months.  Client to prioritize sleep 8+ hours each week night AEB going to bed by 10pm each night.  Client participated in the treatment planning of their therapy.   [name], LCSW, LCAS, CCTP, CCS-I, BSP

## 2021-09-07 ENCOUNTER — Other Ambulatory Visit: Payer: Self-pay | Admitting: Behavioral Health

## 2021-09-07 DIAGNOSIS — F3181 Bipolar II disorder: Secondary | ICD-10-CM

## 2021-09-15 ENCOUNTER — Ambulatory Visit: Payer: BC Managed Care – PPO | Admitting: Behavioral Health

## 2021-09-15 ENCOUNTER — Ambulatory Visit: Payer: BC Managed Care – PPO | Admitting: Addiction (Substance Use Disorder)

## 2021-10-13 ENCOUNTER — Ambulatory Visit (INDEPENDENT_AMBULATORY_CARE_PROVIDER_SITE_OTHER): Payer: BC Managed Care – PPO | Admitting: Addiction (Substance Use Disorder)

## 2021-10-13 ENCOUNTER — Other Ambulatory Visit: Payer: Self-pay

## 2021-10-13 DIAGNOSIS — F3181 Bipolar II disorder: Secondary | ICD-10-CM

## 2021-10-13 DIAGNOSIS — F411 Generalized anxiety disorder: Secondary | ICD-10-CM | POA: Diagnosis not present

## 2021-10-13 NOTE — Progress Notes (Signed)
?      [facility] Counselor/Therapist Progress Note ?  ?Patient ID: Corey Martin, MRN: [id number],   ? ?Date: 10/13/2021 ? ?Time Spent: ? ?Treatment Type: Individual Therapy ? ?Reported Symptoms: burdened, tired, burnt out.  ? ?Mental Status Exam: ? ?Appearance:   Casual and Neat     ?Behavior:  Appropriate  ?Motor:  Normal  ?Speech/Language:   Normal Rate  ?Affect:  Appropriate and Congruent  ?Mood:  decreased range and labile  ?Thought process:  normal  ?Thought content:    Rumination  ?Sensory/Perceptual disturbances:    WNL  ?Orientation:  x4  ?Attention:  Good  ?Concentration:  Good  ?Memory:  WNL  ?Fund of knowledge:   Good  ?Insight:    Good  ?Judgment:   Good  ?Impulse Control:  Good  ? ?Risk Assessment: ?Danger to Self:  No ?Self-injurious Behavior: No ?Danger to Others: No ?Duty to Warn:no ?Physical Aggression / Violence:No  ?Access to Firearms a concern: No  ?Gang Involvement:No  ? ?Subjective: Client reported feeling burdened by the tasks at work and emotional family and friend drama. 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 Client processed a plan of dealing with it: pulling back if he needs to rest and keeping from over-committing at work by discussing their family needs more with his wife. Client processed his stressors and feelings about the drama with his two sets of friends and also with his wife's sister and mom (about their dad who was recently hospitalized). Therapist supported client as he processed his feelings and thoughts (CBT) and MI. Therapist assessed for client MH stability but client denied SI/HI/AVH. ? ?Interventions: Cognitive Behavioral Therapy and Motivational Interviewing & RPT ? ?Diagnosis: ?No diagnosis found. ? ? ?Plan of Care:  ?Client to return for weekly therapy with [name], therapist, to review again in 6 months.  ?Client to engage in positive self talk and challenging negative internal ruminations and  self talk causing client to be overly anxious and worried using CBT, on daily practice. ?Client to engage in mindfulness: ie body scans each eveneing to help process and discharge emotional distress & recognize emotions. ?Client to utilize BSP (brainspotting) with therapist to help client regulate their anxiety in a somatic- felt body sense way: (ie by working to reduce muscle tension, ruminations, increased heart rate, constant worrying and feeling "hyper" ) by decreasing anxiety by 33% in the next 6 months.  ?Client to prioritize sleep 8+ hours each week night AEB going to bed by 10pm each night.  ?Client participated in the treatment planning of their therapy.  ? ?[name], LCSW, LCAS, CCTP, CCS-I, BSP ? ? ? ? ? ? ? ? ? ?

## 2021-10-20 ENCOUNTER — Ambulatory Visit: Payer: BC Managed Care – PPO | Admitting: Behavioral Health

## 2021-10-20 ENCOUNTER — Encounter: Payer: Self-pay | Admitting: Behavioral Health

## 2021-10-20 ENCOUNTER — Other Ambulatory Visit: Payer: Self-pay

## 2021-10-20 DIAGNOSIS — F3181 Bipolar II disorder: Secondary | ICD-10-CM

## 2021-10-20 DIAGNOSIS — F4321 Adjustment disorder with depressed mood: Secondary | ICD-10-CM

## 2021-10-20 DIAGNOSIS — F4323 Adjustment disorder with mixed anxiety and depressed mood: Secondary | ICD-10-CM

## 2021-10-20 DIAGNOSIS — F411 Generalized anxiety disorder: Secondary | ICD-10-CM

## 2021-10-20 MED ORDER — BUPROPION HCL ER (XL) 300 MG PO TB24: 300.0000 mg | ORAL_TABLET | Freq: Every day | ORAL | 5 refills | Status: DC

## 2021-10-20 MED ORDER — LAMOTRIGINE 100 MG PO TABS: 100.0000 mg | ORAL_TABLET | Freq: Every day | ORAL | 1 refills | Status: DC

## 2021-10-20 MED ORDER — BUPROPION HCL ER (XL) 300 MG PO TB24: 300.0000 mg | ORAL_TABLET | Freq: Every day | ORAL | 3 refills | Status: DC

## 2021-10-20 NOTE — Progress Notes (Signed)
[facility] Med Check ? ?Patient ID: Keane Police,  ?MRN: [id number] ? ?PCP: Charlane Ferretti, DO ? ?Date of Evaluation: 10/20/2021 ?Time spent:20 minutes ? ?Chief Complaint:  ?Chief Complaint   ?Anxiety; Depression; Follow-up; Medication Refill; Medication Problem ?  ? ? ?HISTORY/CURRENT STATUS: ?HPI ? ?46 year old presents to this office for follow up and for medication management.He says that he is "pretty good".  Says that he feels he has made progress since the passing of his father but not there 100%.  He continue to see [name] for therapy and he believes that this is where he will have to deal with most of his problems. He recently forgot to take his meds and felt crappy. Says his family noticed the changes and he discovered the problem. Doing well right now and no changes to meds indicated.  Says his anxiety level today is 2/10 and depression at 2/10. Says he is sleeping 7-8 hours per night. No mania, No psychosis, No SI/HI. ?  ?No prior psychiatric medication failures noted  ? ? ? ? ? ?Individual Medical History/ Review of Systems: Changes? :No  ? ?Allergies: Patient has no known allergies. ? ?Current Medications:  ?Current Outpatient Medications:  ?  Choline Fenofibrate (FENOFIBRIC ACID) 135 MG CPDR, TAKE 1 CAPSULE BY MOUTH EVERY DAY, Disp: 90 capsule, Rfl: 1 ?  Insulin Pen Needle 31G X 8 MM MISC, Use as directed, Disp: 100 each, Rfl: 3 ?  LORazepam (ATIVAN) 0.5 MG tablet, Take 1 tablet (0.5 mg total) by mouth every 8 (eight) hours as needed for anxiety., Disp: 30 tablet, Rfl: 0 ?  metFORMIN (GLUCOPHAGE) 500 MG tablet, TAKE 1 TABLET (500 MG TOTAL) BY MOUTH 2 (TWO) TIMES DAILY WITH A MEAL. NO RF UNTIL OV, Disp: 120 tablet, Rfl: 0 ?  olmesartan-hydrochlorothiazide (BENICAR HCT) 40-12.5 MG tablet, TAKE 1 TABLET BY MOUTH EVERY DAY IN THE MORNING, Disp: 90 tablet, Rfl: 3 ?  rosuvastatin (CRESTOR) 10 MG tablet, TAKE 1 TABLET BY MOUTH EVERY DAY, Disp: 90 tablet, Rfl: 3 ?  vitamin B-12 (CYANOCOBALAMIN)  1000 MCG tablet, Take 1 tablet (1,000 mcg total) by mouth daily., Disp:  , Rfl:  ?  Vitamin D, Ergocalciferol, (DRISDOL) 1.25 MG (50000 UNIT) CAPS capsule, Take 1 capsule (50,000 Units total) by mouth every 7 (seven) days., Disp: 12 capsule, Rfl: 1 ?  buPROPion (WELLBUTRIN XL) 300 MG 24 hr tablet, Take 1 tablet (300 mg total) by mouth daily. Needs office visit, Disp: 30 tablet, Rfl: 5 ?  lamoTRIgine (LAMICTAL) 100 MG tablet, Take 1 tablet (100 mg total) by mouth daily., Disp: 90 tablet, Rfl: 1 ?Medication Side Effects: none ? ?Family Medical/ Social History: Changes? No ? ?MENTAL HEALTH EXAM: ? ?There were no vitals taken for this visit.There is no height or weight on file to calculate BMI.  ?General Appearance: Casual  ?Eye Contact:  Good  ?Speech:  Clear and Coherent  ?Volume:  Normal  ?Mood:  NA  ?Affect:  Appropriate  ?Thought Process:  Coherent  ?Orientation:  Full (Time, Place, and Person)  ?Thought Content: Logical   ?Suicidal Thoughts:  No  ?Homicidal Thoughts:  No  ?Memory:  WNL  ?Judgement:  Good  ?Insight:  Good  ?Psychomotor Activity:  Normal  ?Concentration:  Concentration: Good  ?Recall:  Good  ?Fund of Knowledge: Good  ?Language: Good  ?Assets:  Desire for Improvement  ?ADL's:  Intact  ?Cognition: WNL  ?Prognosis:  Good  ? ? ?DIAGNOSES:  ?  ICD-10-CM   ?1. Bipolar  II disorder (HCC)  F31.81 lamoTRIgine (LAMICTAL) 100 MG tablet  ?  ?2. Generalized anxiety disorder  F41.1   ?  ?3. Grief reaction  F43.21   ?  ?4. Mood D/o   F43.23 buPROPion (WELLBUTRIN XL) 300 MG 24 hr tablet  ?  DISCONTINUED: buPROPion (WELLBUTRIN XL) 300 MG 24 hr tablet  ?  ? ? ?Receiving Psychotherapy: No  ? ? ?RECOMMENDATIONS:  ? ?Continue on Lamictal 100 mg daily ?Continue on Wellbutrin 300 mg daily ?Not taking  0.5 Ativan 3 times daily PRN ?Will report any worsening symptoms or changes promplty ?Greater than 50% of 20 min. face to face time with patient was spent on counseling and coordination of care. We discussed how he continues  to cope with loss of his father. Experienced psycho-sensory stimulation when opening bag of fathers old clothing. He feels like he is in a good place now. Forgot to take his medication and experienced some relapse but identified the problem and is back to baseline.   ? ? ?Joan Flores, NP  ?

## 2021-11-10 ENCOUNTER — Ambulatory Visit: Payer: BC Managed Care – PPO | Admitting: Addiction (Substance Use Disorder)

## 2021-11-20 DIAGNOSIS — I10 Essential (primary) hypertension: Secondary | ICD-10-CM | POA: Diagnosis not present

## 2021-11-20 DIAGNOSIS — Z23 Encounter for immunization: Secondary | ICD-10-CM | POA: Diagnosis not present

## 2021-11-20 DIAGNOSIS — R7303 Prediabetes: Secondary | ICD-10-CM | POA: Diagnosis not present

## 2021-11-20 DIAGNOSIS — Z1331 Encounter for screening for depression: Secondary | ICD-10-CM | POA: Diagnosis not present

## 2021-11-20 DIAGNOSIS — Z1389 Encounter for screening for other disorder: Secondary | ICD-10-CM | POA: Diagnosis not present

## 2021-12-09 ENCOUNTER — Ambulatory Visit (INDEPENDENT_AMBULATORY_CARE_PROVIDER_SITE_OTHER): Payer: BC Managed Care – PPO | Admitting: Addiction (Substance Use Disorder)

## 2021-12-09 DIAGNOSIS — F3181 Bipolar II disorder: Secondary | ICD-10-CM | POA: Diagnosis not present

## 2021-12-09 NOTE — Progress Notes (Signed)
?      [facility] Counselor/Therapist Progress Note ?  ?Patient ID: Corey Martin, MRN: [id number],   ? ?Date: 12/09/2021 ? ?Time Spent: ? ?Treatment Type: Individual Therapy ? ?Reported Symptoms: frustrated and stressed, some fears ? ?Mental Status Exam: ? ?Appearance:   Casual and Neat     ?Behavior:  Appropriate  ?Motor:  Normal  ?Speech/Language:   Normal Rate  ?Affect:  Appropriate and Congruent  ?Mood:  anxious and stressed  ?Thought process:  normal  ?Thought content:    Rumination  ?Sensory/Perceptual disturbances:    WNL  ?Orientation:  x4  ?Attention:  Good  ?Concentration:  Good  ?Memory:  WNL  ?Fund of knowledge:   Good  ?Insight:    Good  ?Judgment:   Good  ?Impulse Control:  Good  ? ?Risk Assessment: ?Danger to Self:  No ?Self-injurious Behavior: No ?Danger to Others: No ?Duty to Warn:no ?Physical Aggression / Violence:No  ?Access to Firearms a concern: No  ?Gang Involvement:No  ? ?Subjective: Client reported that a lot of negative things happened at church while they were gone on vacation in [place]. Client reported feeling super rested and good until the stress of being gone 2 weeks caught up with him. Therapist used MI to affirm client's struggle and CBT to help client process his thoughts about trying to manage the mess that he came back to. Client also processed his stress and fears around starting to lose weight again and failing or not enjoying his life so much while doing it, that he wants to quit. Therapist supported client in challenging the fears and thinking of other ways to help him cope while taking the medication if it makes him feel bad. Therapist assessed for client MH stability but client denied SI/HI/AVH. ? ?Interventions: Cognitive Behavioral Therapy and Motivational Interviewing & RPT ? ?Diagnosis: ?  ICD-10-CM   ?1. Bipolar II disorder (HCC)  F31.81   ?  ? ? ? ?Plan of Care:  ?Client to return for weekly therapy with [name], therapist, to review again in 6 months.   ?Client to engage in positive self talk and challenging negative internal ruminations and self talk causing client to be overly anxious and worried using CBT, on daily practice. ?Client to engage in mindfulness: ie body scans each eveneing to help process and discharge emotional distress & recognize emotions. ?Client to utilize BSP (brainspotting) with therapist to help client regulate their anxiety in a somatic- felt body sense way: (ie by working to reduce muscle tension, ruminations, increased heart rate, constant worrying and feeling "hyper" ) by decreasing anxiety by 33% in the next 6 months.  ?Client to prioritize sleep 8+ hours each week night AEB going to bed by 10pm each night.  ?Client participated in the treatment planning of their therapy.  ? ?[name], LCSW, LCAS, CCTP, CCS-I, BSP ? ? ? ? ? ? ? ? ? ?

## 2021-12-24 IMAGING — CT CT CARDIAC CORONARY ARTERY CALCIUM SCORE
3 series · 14 of 20 positions shown, 16 images · non-contrast
Comparison: None.

CLINICAL DATA: 44-year-old Caucasian male with history of
hyperlipidemia and family history of heart disease.

EXAM:
CT CARDIAC CORONARY ARTERY CALCIUM SCORE
TECHNIQUE: Non-contrast imaging through the heart was performed using
prospective ECG gating. Image post processing was performed on an
independent workstation, allowing for quantitative analysis of the
heart and coronary arteries. Note that this exam targets the heart
and the chest was not imaged in its entirety.

[Series 2: calcium scoring 2.00 qr36 bestdiast 70% hrt calciu · axial · 0.49mm/px · z∈[+1579,+1643]mm · 4 of 54 slices shown]
[im 11/54  vessel]
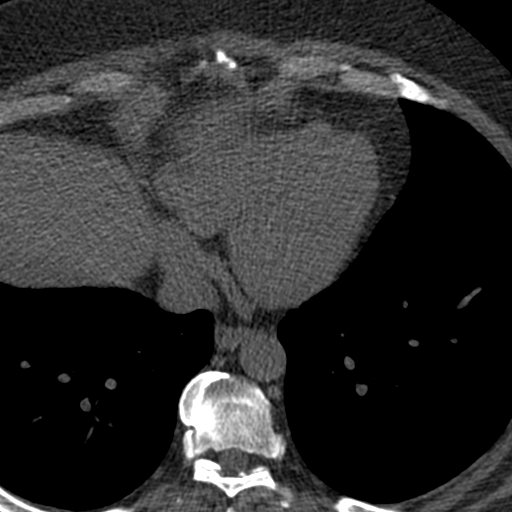
[im 22/54  vessel]
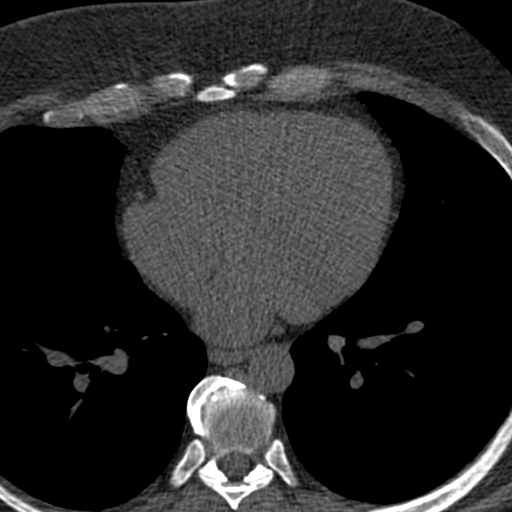
[im 32/54  vessel]
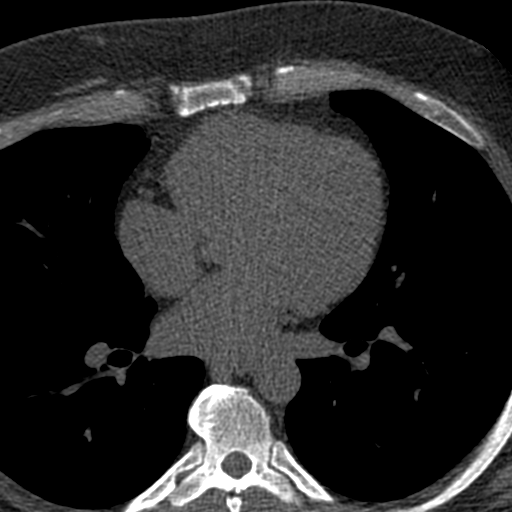
[im 43/54  vessel]
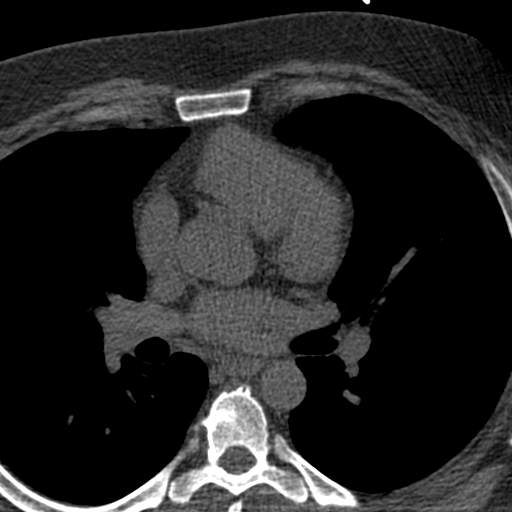

[Series 3: calcium scoring 2.00 br40 bestdiast 70% axial · axial · 0.61mm/px · z∈[+1575,+1647]mm · 5 of 54 slices shown, 7 images]
[im 9/54  vessel]
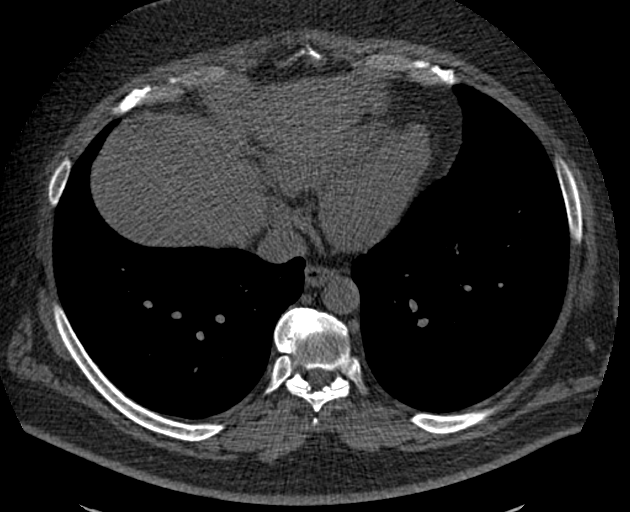
[im 9/54  lung]
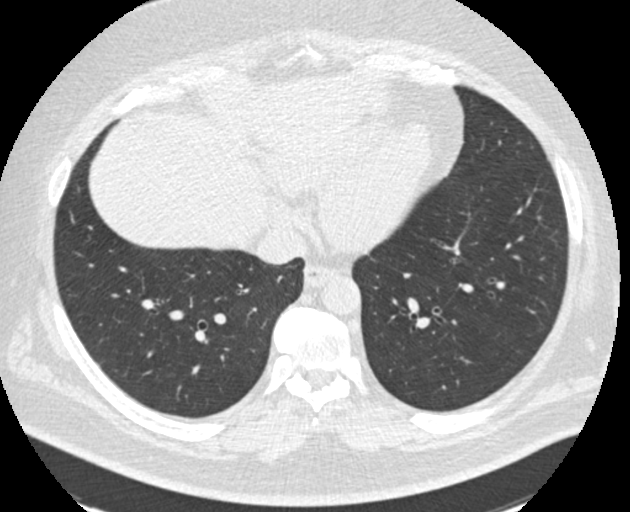
[im 18/54  vessel]
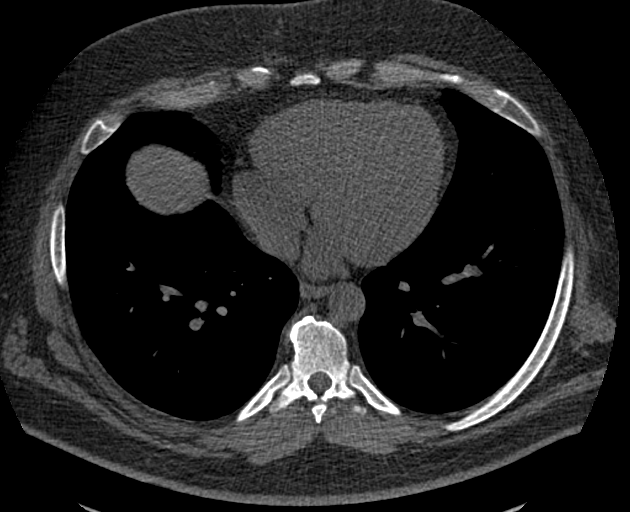
[im 27/54  vessel]
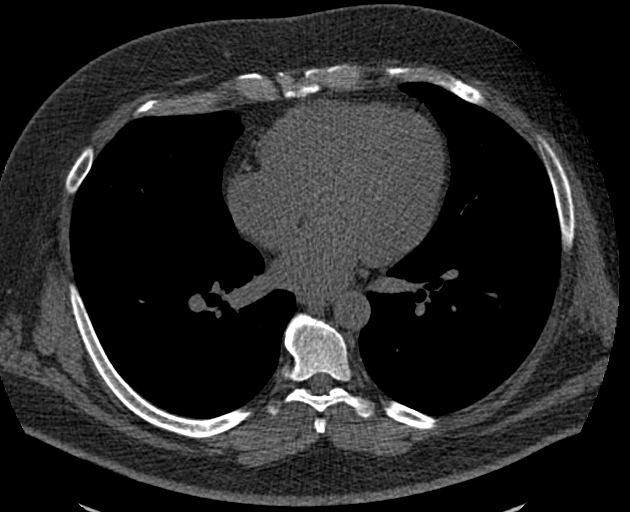
[im 36/54  vessel]
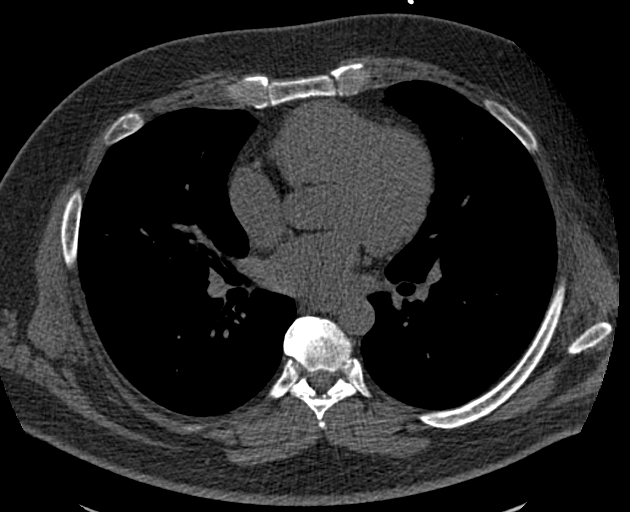
[im 45/54  vessel]
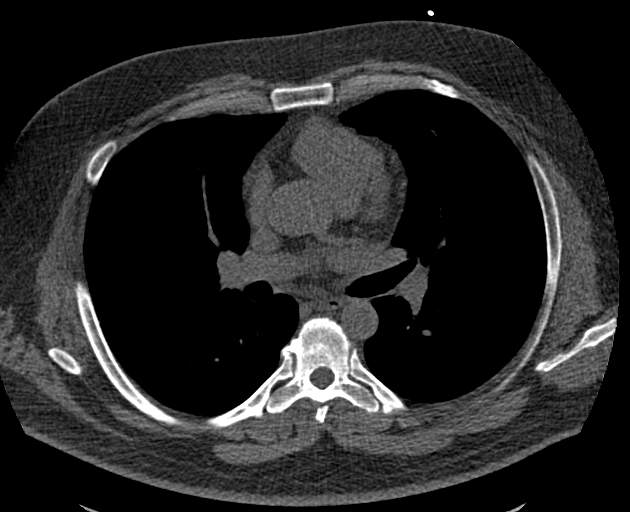
[im 45/54  lung]
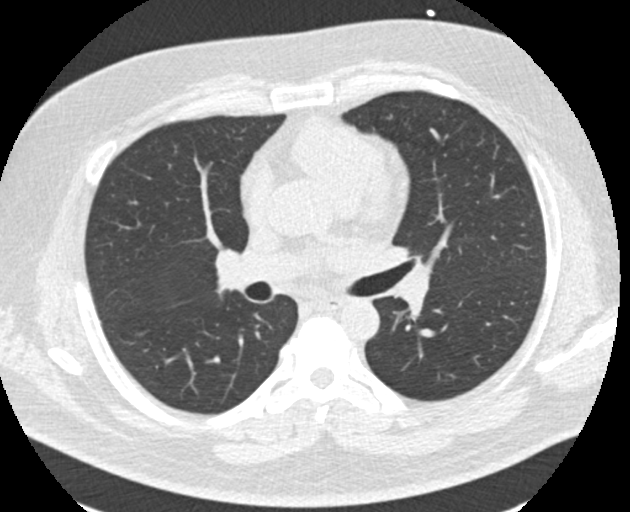

[Series 9: calcium scoring 2.00 br60 bestdiast 70% lungs · axial · 0.61mm/px · z∈[+1575,+1647]mm · 5 of 54 slices shown]
[im 9/54  vessel]
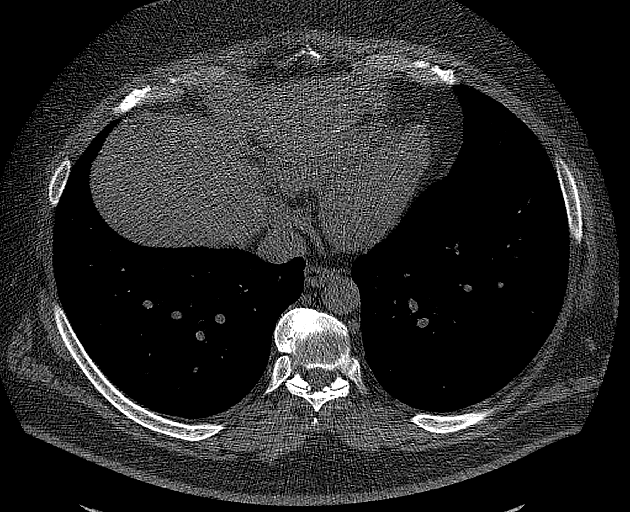
[im 18/54  vessel]
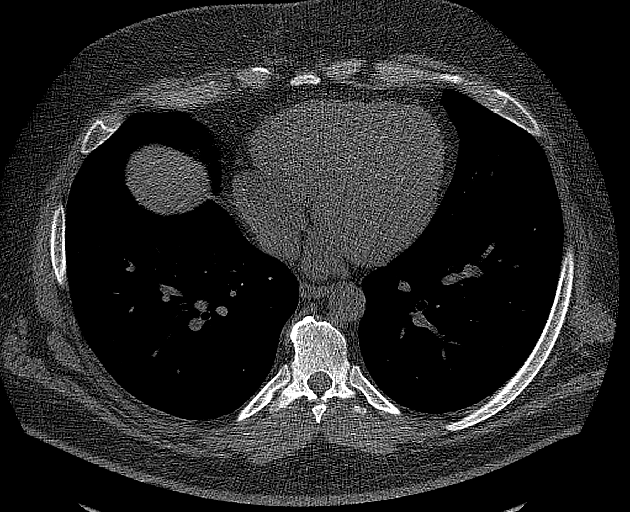
[im 27/54  vessel]
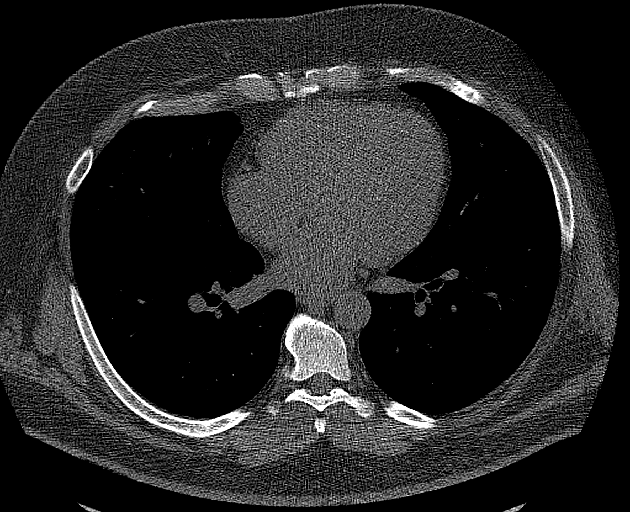
[im 36/54  vessel]
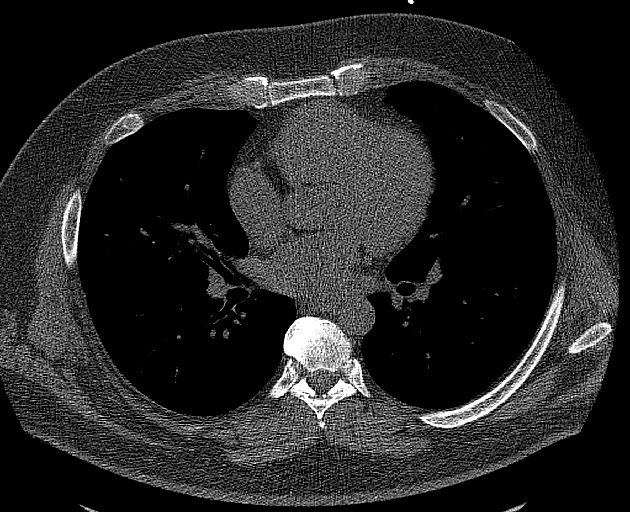
[im 45/54  vessel]
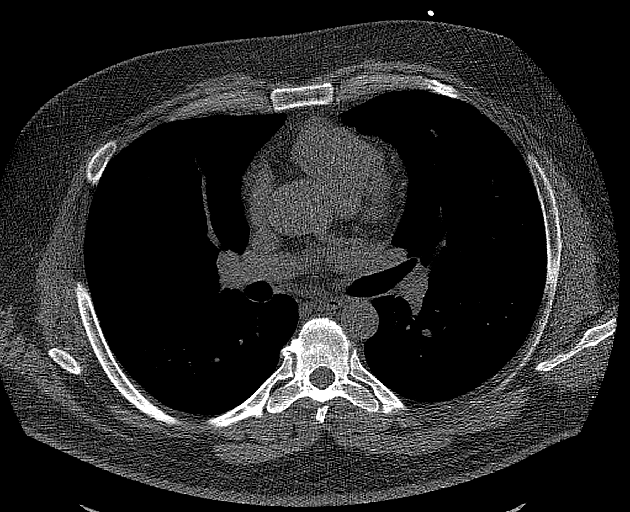

[14 of 20 positions shown; findings below may reference images not displayed]

FINDINGS: CORONARY CALCIUM SCORES:

Left Main: 0

LAD: 0

LCx: 0

RCA: 0

Total Agatston Score: 0

[HOSPITAL] percentile: 0

AORTA MEASUREMENTS:

Ascending Aorta: 33 mm

Descending Aorta: 24 mm

OTHER FINDINGS:


## 2022-01-01 ENCOUNTER — Other Ambulatory Visit (HOSPITAL_COMMUNITY): Payer: Self-pay

## 2022-01-05 ENCOUNTER — Ambulatory Visit: Payer: BC Managed Care – PPO | Admitting: Addiction (Substance Use Disorder)

## 2022-01-05 ENCOUNTER — Other Ambulatory Visit (HOSPITAL_COMMUNITY): Payer: Self-pay

## 2022-01-05 DIAGNOSIS — M5410 Radiculopathy, site unspecified: Secondary | ICD-10-CM | POA: Diagnosis not present

## 2022-01-08 ENCOUNTER — Other Ambulatory Visit (HOSPITAL_COMMUNITY): Payer: Self-pay

## 2022-02-09 ENCOUNTER — Ambulatory Visit: Payer: BC Managed Care – PPO | Admitting: Addiction (Substance Use Disorder)

## 2022-02-09 DIAGNOSIS — F411 Generalized anxiety disorder: Secondary | ICD-10-CM

## 2022-02-09 DIAGNOSIS — F4323 Adjustment disorder with mixed anxiety and depressed mood: Secondary | ICD-10-CM | POA: Diagnosis not present

## 2022-02-09 NOTE — Progress Notes (Signed)
      [facility] Counselor/Therapist Progress Note   Patient ID: Varun Jourdan, MRN: [id number],    Date: 02/09/2022  Time Spent:  Treatment Type: Individual Therapy  Reported Symptoms: exhausted, negative   Mental Status Exam:  Appearance:   Casual and Well Groomed     Behavior:  Appropriate  Motor:  Normal  Speech/Language:   Normal Rate  Affect:  Appropriate and Congruent  Mood:  irritable and exhausted  Thought process:  normal  Thought content:    Rumination  Sensory/Perceptual disturbances:    WNL  Orientation:  x4  Attention:  Good  Concentration:  Good  Memory:  WNL  Fund of knowledge:   Good  Insight:    Good  Judgment:   Good  Impulse Control:  Fair   Risk Assessment: Danger to Self:  No Self-injurious Behavior: No Danger to Others: No Duty to Warn:no Physical Aggression / Violence:No  Access to Firearms a concern: No  Gang Involvement:No   Subjective: Client reported just feeling like everything happening in many 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 Client grieving the lack of control he has had over this church trauma and the mess it has caused. Client feeling so frustrated he cant get the weight loss drug he was prescribed due to their lack of production. Client looking forward to the [facility] opening and is determined go no matter what obstacles. However, client is grieving the fact that he is the "heaviest he has been in 10 years" and is so frustrated he cant get the help he wants/needs so badly. Therapist used MI, CBT, and grief therapy with client to help validate his feelings, support his processing of grief and frustrations. Therapist assessed for client MH stability but client denied SI/HI/AVH.  Interventions: Cognitive Behavioral Therapy, Motivational  Interviewing, and Grief Therapy & RPT  Diagnosis:   ICD-10-CM   1. Mood D/o   F43.23     2. Generalized anxiety disorder  F41.1       Plan of Care:  Client to return for weekly therapy with [name], therapist, to review again in 6 months.  Client to engage in positive self talk and challenging negative internal ruminations and self talk causing client to be overly anxious and worried using CBT, on daily practice. Client to engage in mindfulness: ie body scans each eveneing to help process and discharge emotional distress & recognize emotions. Client to utilize BSP (brainspotting) with therapist to help client regulate their anxiety in a somatic- felt body sense way: (ie by working to reduce muscle tension, ruminations, increased heart rate, constant worrying and feeling "hyper" ) by decreasing anxiety by 33% in the next 6 months.  Client to prioritize sleep 8+ hours each week night AEB going to bed by 10pm each night Progress: client unable to make progress towards his health goals due to obstacles of getting into a weight loss doctor and on his necessary medication.  [name], LCSW, LCAS, CCTP, CCS-I, BSP

## 2022-02-16 DIAGNOSIS — M9903 Segmental and somatic dysfunction of lumbar region: Secondary | ICD-10-CM | POA: Diagnosis not present

## 2022-02-16 DIAGNOSIS — R7303 Prediabetes: Secondary | ICD-10-CM | POA: Diagnosis not present

## 2022-02-22 ENCOUNTER — Other Ambulatory Visit (HOSPITAL_COMMUNITY): Payer: Self-pay

## 2022-02-22 DIAGNOSIS — E8881 Metabolic syndrome: Secondary | ICD-10-CM | POA: Diagnosis not present

## 2022-02-22 DIAGNOSIS — R5383 Other fatigue: Secondary | ICD-10-CM | POA: Diagnosis not present

## 2022-02-22 DIAGNOSIS — E8889 Other specified metabolic disorders: Secondary | ICD-10-CM | POA: Diagnosis not present

## 2022-02-22 DIAGNOSIS — I10 Essential (primary) hypertension: Secondary | ICD-10-CM | POA: Diagnosis not present

## 2022-02-22 MED ORDER — WEGOVY 0.25 MG/0.5ML ~~LOC~~ SOAJ: SUBCUTANEOUS | 0 refills | Status: DC

## 2022-02-22 MED ORDER — WEGOVY 0.25 MG/0.5ML ~~LOC~~ SOAJ
SUBCUTANEOUS | 0 refills | Status: DC
  Filled 2022-02-22: qty 2, 28d supply, fill #0

## 2022-03-03 ENCOUNTER — Encounter (INDEPENDENT_AMBULATORY_CARE_PROVIDER_SITE_OTHER): Payer: Self-pay

## 2022-03-05 ENCOUNTER — Other Ambulatory Visit (HOSPITAL_COMMUNITY): Payer: Self-pay

## 2022-03-09 ENCOUNTER — Ambulatory Visit: Payer: BC Managed Care – PPO | Admitting: Addiction (Substance Use Disorder)

## 2022-03-09 DIAGNOSIS — I10 Essential (primary) hypertension: Secondary | ICD-10-CM | POA: Diagnosis not present

## 2022-03-09 DIAGNOSIS — E8881 Metabolic syndrome: Secondary | ICD-10-CM | POA: Diagnosis not present

## 2022-03-09 DIAGNOSIS — E559 Vitamin D deficiency, unspecified: Secondary | ICD-10-CM | POA: Diagnosis not present

## 2022-03-09 NOTE — Progress Notes (Signed)
Client had work emergency. No charge.

## 2022-03-10 ENCOUNTER — Other Ambulatory Visit (HOSPITAL_COMMUNITY): Payer: Self-pay

## 2022-03-10 MED ORDER — ERGOCALCIFEROL 1.25 MG (50000 UT) PO CAPS
ORAL_CAPSULE | ORAL | 2 refills | Status: AC
  Filled 2022-03-10: qty 4, 28d supply, fill #0

## 2022-03-10 MED ORDER — WEGOVY 0.5 MG/0.5ML ~~LOC~~ SOAJ
SUBCUTANEOUS | 0 refills | Status: DC
  Filled 2022-03-10: qty 2, 28d supply, fill #0

## 2022-03-19 ENCOUNTER — Other Ambulatory Visit (HOSPITAL_COMMUNITY): Payer: Self-pay

## 2022-03-22 DIAGNOSIS — M25561 Pain in right knee: Secondary | ICD-10-CM | POA: Diagnosis not present

## 2022-04-05 ENCOUNTER — Other Ambulatory Visit: Payer: Self-pay | Admitting: Cardiology

## 2022-04-05 DIAGNOSIS — I10 Essential (primary) hypertension: Secondary | ICD-10-CM

## 2022-04-15 ENCOUNTER — Other Ambulatory Visit (HOSPITAL_COMMUNITY): Payer: Self-pay

## 2022-04-15 DIAGNOSIS — E8881 Metabolic syndrome: Secondary | ICD-10-CM | POA: Diagnosis not present

## 2022-04-15 DIAGNOSIS — G4733 Obstructive sleep apnea (adult) (pediatric): Secondary | ICD-10-CM | POA: Diagnosis not present

## 2022-04-15 DIAGNOSIS — I10 Essential (primary) hypertension: Secondary | ICD-10-CM | POA: Diagnosis not present

## 2022-04-15 DIAGNOSIS — E559 Vitamin D deficiency, unspecified: Secondary | ICD-10-CM | POA: Diagnosis not present

## 2022-04-15 MED ORDER — WEGOVY 1.7 MG/0.75ML ~~LOC~~ SOAJ
1.7000 mg | SUBCUTANEOUS | 0 refills | Status: DC
  Filled 2022-05-24: qty 3, 28d supply, fill #0

## 2022-04-15 MED ORDER — WEGOVY 1.7 MG/0.75ML ~~LOC~~ SOAJ
1.7000 mg | SUBCUTANEOUS | 0 refills | Status: DC
  Filled 2022-07-30 – 2022-08-11 (×2): qty 3, 28d supply, fill #0

## 2022-04-15 MED ORDER — WEGOVY 1 MG/0.5ML ~~LOC~~ SOAJ
1.0000 mg | SUBCUTANEOUS | 0 refills | Status: DC
  Filled 2022-04-15: qty 2, 28d supply, fill #0

## 2022-04-15 MED ORDER — ERGOCALCIFEROL 1.25 MG (50000 UT) PO CAPS
1.0000 | ORAL_CAPSULE | ORAL | 2 refills | Status: AC
  Filled 2022-04-15: qty 4, 28d supply, fill #0
  Filled 2022-05-24: qty 4, 28d supply, fill #1
  Filled 2022-07-02: qty 4, 28d supply, fill #2

## 2022-04-15 MED ORDER — WEGOVY 1 MG/0.5ML ~~LOC~~ SOAJ: 1.0000 mg | SUBCUTANEOUS | 0 refills | Status: DC

## 2022-04-22 ENCOUNTER — Ambulatory Visit: Payer: BC Managed Care – PPO | Admitting: Addiction (Substance Use Disorder)

## 2022-04-22 ENCOUNTER — Encounter: Payer: Self-pay | Admitting: Behavioral Health

## 2022-04-22 ENCOUNTER — Ambulatory Visit (INDEPENDENT_AMBULATORY_CARE_PROVIDER_SITE_OTHER): Payer: BC Managed Care – PPO | Admitting: Behavioral Health

## 2022-04-22 DIAGNOSIS — F3181 Bipolar II disorder: Secondary | ICD-10-CM

## 2022-04-22 DIAGNOSIS — F4323 Adjustment disorder with mixed anxiety and depressed mood: Secondary | ICD-10-CM

## 2022-04-22 MED ORDER — LAMOTRIGINE 100 MG PO TABS: 100.0000 mg | ORAL_TABLET | Freq: Every day | ORAL | 1 refills | Status: DC

## 2022-04-22 MED ORDER — BUPROPION HCL ER (XL) 300 MG PO TB24: 300.0000 mg | ORAL_TABLET | Freq: Every day | ORAL | 5 refills | Status: DC

## 2022-04-22 NOTE — Progress Notes (Signed)
[facility]  Patient ID: Corey Martin,  MRN: [id number]  PCP: Sueanne Margarita, DO  Date of Evaluation: 04/22/2022 Time spent:30 minutes  Chief Complaint:  Chief Complaint   Anxiety; Depression; Medication Refill; Patient Education; Follow-up     HISTORY/CURRENT STATUS: HPI  46 year old presents to this office for follow up and for medication management.He says he is very happy with how his medications continue to work. He says there is no need in trying to adjust meds right now. He says he is content and in good place.  Says his anxiety level today is 1/10 and depression at 1/10. Says he is sleeping 7-8 hours per night. No mania, No psychosis, No SI/HI. He will continue in psychotherapy.   No prior psychiatric medication failures noted      Individual Medical History/ Review of Systems: Changes? :No   Allergies: Patient has no known allergies.  Current Medications:  Current Outpatient Medications:    buPROPion (WELLBUTRIN XL) 300 MG 24 hr tablet, Take 1 tablet (300 mg total) by mouth daily. Needs office visit, Disp: 30 tablet, Rfl: 5   Choline Fenofibrate (FENOFIBRIC ACID) 135 MG CPDR, TAKE 1 CAPSULE BY MOUTH EVERY DAY, Disp: 90 capsule, Rfl: 1   ergocalciferol (VITAMIN D2) 1.25 MG (50000 UT) capsule, Take 1 capsule by mouth Once a week, Disp: 4 capsule, Rfl: 2   ergocalciferol (VITAMIN D2) 1.25 MG (50000 UT) capsule, Take 1 capsule (50,000 Units total) by mouth once a week., Disp: 4 capsule, Rfl: 2   Insulin Pen Needle 31G X 8 MM MISC, Use as directed, Disp: 100 each, Rfl: 3   lamoTRIgine (LAMICTAL) 100 MG tablet, Take 1 tablet (100 mg total) by mouth daily., Disp: 90 tablet, Rfl: 1   LORazepam (ATIVAN) 0.5 MG tablet, Take 1 tablet (0.5 mg total) by mouth every 8 (eight) hours as needed for anxiety., Disp: 30 tablet, Rfl: 0   metFORMIN (GLUCOPHAGE) 500 MG tablet, TAKE 1 TABLET (500 MG TOTAL) BY MOUTH 2 (TWO) TIMES DAILY WITH A MEAL. NO RF UNTIL OV, Disp: 120  tablet, Rfl: 0   olmesartan-hydrochlorothiazide (BENICAR HCT) 40-12.5 MG tablet, TAKE 1 TABLET BY MOUTH EVERY DAY IN THE MORNING, Disp: 90 tablet, Rfl: 3   rosuvastatin (CRESTOR) 10 MG tablet, TAKE 1 TABLET BY MOUTH EVERY DAY, Disp: 90 tablet, Rfl: 3   Semaglutide-Weight Management (WEGOVY) 0.25 MG/0.5ML SOAJ, Inject 0.25mg  subcutaneously weekly, Disp: 2 mL, Rfl: 0   Semaglutide-Weight Management (WEGOVY) 0.25 MG/0.5ML SOAJ, Inject 0.25mg  subcutaneously weekly, Disp: 2 mL, Rfl: 0   Semaglutide-Weight Management (WEGOVY) 0.5 MG/0.5ML SOAJ, Inject 0.5mg  under the skin weekly, Disp: 2 mL, Rfl: 0   Semaglutide-Weight Management (WEGOVY) 1 MG/0.5ML SOAJ, Inject 1 mg into the skin once a week., Disp: 2 mL, Rfl: 0   Semaglutide-Weight Management (WEGOVY) 1 MG/0.5ML SOAJ, Inject 1 mg into the skin once a week., Disp: 2 mL, Rfl: 0   Semaglutide-Weight Management (WEGOVY) 1.7 MG/0.75ML SOAJ, Inject 1.7 mg into the skin once a week., Disp: 3 mL, Rfl: 0   Semaglutide-Weight Management (WEGOVY) 1.7 MG/0.75ML SOAJ, Inject 1.7 mg into the skin once a week., Disp: 3 mL, Rfl: 0   vitamin B-12 (CYANOCOBALAMIN) 1000 MCG tablet, Take 1 tablet (1,000 mcg total) by mouth daily., Disp:  , Rfl:    Vitamin D, Ergocalciferol, (DRISDOL) 1.25 MG (50000 UNIT) CAPS capsule, Take 1 capsule (50,000 Units total) by mouth every 7 (seven) days., Disp: 12 capsule, Rfl: 1 Medication Side Effects: none  Family  Medical/ Social History: Changes? No  MENTAL HEALTH EXAM:  There were no vitals taken for this visit.There is no height or weight on file to calculate BMI.  General Appearance: Casual, Neat, and Well Groomed  Eye Contact:  Good  Speech:  Clear and Coherent  Volume:  Normal  Mood:  NA  Affect:  Appropriate  Thought Process:  Coherent  Orientation:  Full (Time, Place, and Person)  Thought Content: Logical   Suicidal Thoughts:  No  Homicidal Thoughts:  No  Memory:  WNL  Judgement:  Good  Insight:  Good  Psychomotor  Activity:  Normal  Concentration:  Concentration: Good  Recall:  Good  Fund of Knowledge: Good  Language: Good  Assets:  Desire for Improvement  ADL's:  Intact  Cognition: WNL  Prognosis:  Good    DIAGNOSES:    ICD-10-CM   1. Bipolar II disorder (HCC)  F31.81 lamoTRIgine (LAMICTAL) 100 MG tablet    2. Mood D/o   F43.23 buPROPion (WELLBUTRIN XL) 300 MG 24 hr tablet      Receiving Psychotherapy: No    RECOMMENDATIONS:    Continue on Lamictal 100 mg daily Continue on Wellbutrin 300 mg daily Not taking  0.5 Ativan 3 times daily PRN Will report any worsening symptoms or changes promplty Greater than 50% of 30 min. face to face time with patient was spent on counseling and coordination of care. We discussed his continued stability and he is very happy with how his medications continue to work well.  He feels like he is in a good place now.    Joan Flores, NP

## 2022-05-11 ENCOUNTER — Ambulatory Visit: Payer: BC Managed Care – PPO | Admitting: Addiction (Substance Use Disorder)

## 2022-05-11 DIAGNOSIS — F3181 Bipolar II disorder: Secondary | ICD-10-CM | POA: Diagnosis not present

## 2022-05-11 NOTE — Progress Notes (Signed)
      [facility] Counselor/Therapist Progress Note   Patient ID: Corey Martin, MRN: [id number],    Date: 05/11/2022  Time Spent: 55 mins  Treatment Type: Individual Therapy  Reported Symptoms: disbelief, defeated, guilty   Mental Status Exam:  Appearance:   Casual and Neat     Behavior:  Appropriate  Motor:  Normal  Speech/Language:   Normal Rate  Affect:  Appropriate and Congruent  Mood:  sad and stressed  Thought process:  normal  Thought content:    Rumination  Sensory/Perceptual disturbances:    WNL  Orientation:  x4  Attention:  Good  Concentration:  Good  Memory:  WNL  Fund of knowledge:   Good  Insight:    Good  Judgment:   Good  Impulse Control:  Good   Risk Assessment: Danger to Self:  No Self-injurious Behavior: No Danger to Others: No Duty to Warn:no Physical Aggression / Violence:No  Access to Firearms a concern: No  Gang Involvement:No   Subjective: Client reported the extreme struggle since having meniscus surgery. Client processed his feelings of guilt for having to lean on his wife for all of his care and needs. Client reported his wife is irritable, overwhelmed, upset, and tired caretaking for him. Client reported not being prepared at all for the rehab portion after the surgery. Client processed feeling disbelief about the "delusion" he feels he was in about the aftermath of the surgery. Client reported that he must not have heard the details of exactly what would happen post surgery and therefore, didn't make good arrangements for post surgical care. Therapist used MI & CBT with client to validate his feelings and help him process his emotions and thoughts about the recent changes while also help him to explore why he is feeling guilty or delusional. Therapist assessed for client Corey Martin stability but client denied SI/HI/AVH.  Interventions: Cognitive Behavioral Therapy and Motivational Interviewing & RPT  Diagnosis: No diagnosis found.   Plan of  Care:  Client to return for weekly therapy with Sammuel Cooper, therapist, to review again in 6 months.  Client to engage in positive self talk and challenging negative internal ruminations and self talk causing client to be overly anxious and worried using CBT, on daily practice. Client to engage in mindfulness: ie body scans each eveneing to help process and discharge emotional distress & recognize emotions. Client to utilize BSP (brainspotting) with therapist to help client regulate their anxiety in a somatic- felt body sense way: (ie by working to reduce muscle tension, ruminations, increased heart rate, constant worrying and feeling "hyper" ) by decreasing anxiety by 33% in the next 6 months.  Client to prioritize sleep 8+ hours each week night AEB going to bed by 10pm each night.  Client participated in the treatment planning of their therapy.   Barnie Del, LCSW, LCAS, CCTP, CCS-I, BSP

## 2022-05-24 ENCOUNTER — Other Ambulatory Visit (HOSPITAL_COMMUNITY): Payer: Self-pay

## 2022-06-10 ENCOUNTER — Other Ambulatory Visit (HOSPITAL_COMMUNITY): Payer: Self-pay

## 2022-06-10 ENCOUNTER — Ambulatory Visit: Payer: BC Managed Care – PPO | Admitting: Addiction (Substance Use Disorder)

## 2022-06-10 DIAGNOSIS — I10 Essential (primary) hypertension: Secondary | ICD-10-CM | POA: Diagnosis not present

## 2022-06-10 DIAGNOSIS — F411 Generalized anxiety disorder: Secondary | ICD-10-CM

## 2022-06-10 DIAGNOSIS — E88819 Insulin resistance, unspecified: Secondary | ICD-10-CM | POA: Diagnosis not present

## 2022-06-10 DIAGNOSIS — F4321 Adjustment disorder with depressed mood: Secondary | ICD-10-CM

## 2022-06-10 DIAGNOSIS — G4733 Obstructive sleep apnea (adult) (pediatric): Secondary | ICD-10-CM | POA: Diagnosis not present

## 2022-06-10 DIAGNOSIS — E559 Vitamin D deficiency, unspecified: Secondary | ICD-10-CM | POA: Diagnosis not present

## 2022-06-10 MED ORDER — ERGOCALCIFEROL 1.25 MG (50000 UT) PO CAPS
50000.0000 [IU] | ORAL_CAPSULE | ORAL | 2 refills | Status: DC
  Filled 2022-06-10: qty 4, 28d supply, fill #0
  Filled 2022-06-14: qty 12, 84d supply, fill #0
  Filled 2022-10-05: qty 4, 28d supply, fill #0
  Filled 2022-12-28: qty 4, 28d supply, fill #1
  Filled 2023-01-31: qty 4, 28d supply, fill #2

## 2022-06-10 MED ORDER — WEGOVY 1.7 MG/0.75ML ~~LOC~~ SOAJ
1.7000 mg | SUBCUTANEOUS | 1 refills | Status: DC
  Filled 2022-06-10 – 2022-06-30 (×3): qty 3, 28d supply, fill #0

## 2022-06-10 NOTE — Progress Notes (Signed)
      [facility] Counselor/Therapist Progress Note   Patient ID: Corey Martin, MRN: [id number],    Date: 06/10/2022  Time Spent:  Treatment Type: Individual Therapy  Reported Symptoms: grieving, overwhelmed, sad  Mental Status Exam:  Appearance:   Casual and Neat     Behavior:  Appropriate  Motor:  Normal  Speech/Language:   Normal Rate  Affect:  Appropriate and Congruent  Mood:  anxious, sad, and stressed  Thought process:  normal  Thought content:    Rumination  Sensory/Perceptual disturbances:    WNL  Orientation:  x4  Attention:  Good  Concentration:  Good  Memory:  WNL  Fund of knowledge:   Good  Insight:    Good  Judgment:   Good  Impulse Control:  Good   Risk Assessment: Danger to Self:  No Self-injurious Behavior: No Danger to Others: No Duty to Warn:no Physical Aggression / Violence:No  Access to Firearms a concern: No  Gang Involvement:No   Subjective: Client brought his wife to therapy today after her father passed away last night and her mother was diagnosed with breast cancer 2 weeks ago. Client and client's wife are grieving and trying to process these emotions and life events together, without ignoring each other's feelings and without snapping at eachother. Therapist used MI and CBT and grief therapy to help support client and client's wife in process their feelings, grief, and thoughts about all the occurrences. Client and his wife talked about their concern of watching their daughter process her grief and the pain of listening to her pain/sadness.  Client processed his guilt for having his wife have to care take for him more esp before her dad passed. Therapist assessed for client MH stability but client denied SI/HI/AVH.  Interventions: Cognitive Behavioral Therapy and Motivational Interviewing & RPT  Diagnosis: No diagnosis found.   Plan of Care:  Client to return for weekly therapy with [name], therapist, to review again in 6  months.  Client to engage in positive self talk and challenging negative internal ruminations and self talk causing client to be overly anxious and worried using CBT, on daily practice. Client to engage in mindfulness: ie body scans each eveneing to help process and discharge emotional distress & recognize emotions. Client to utilize BSP (brainspotting) with therapist to help client regulate their anxiety in a somatic- felt body sense way: (ie by working to reduce muscle tension, ruminations, increased heart rate, constant worrying and feeling "hyper" ) by decreasing anxiety by 33% in the next 6 months.  Client to prioritize sleep 8+ hours each week night AEB going to bed by 10pm each night.  Client participated in the treatment planning of their therapy.   [name], LCSW, LCAS, CCTP, CCS-I, BSP

## 2022-06-14 ENCOUNTER — Other Ambulatory Visit (HOSPITAL_COMMUNITY): Payer: Self-pay

## 2022-06-30 ENCOUNTER — Other Ambulatory Visit (HOSPITAL_BASED_OUTPATIENT_CLINIC_OR_DEPARTMENT_OTHER): Payer: Self-pay

## 2022-06-30 ENCOUNTER — Other Ambulatory Visit (HOSPITAL_COMMUNITY): Payer: Self-pay

## 2022-07-01 ENCOUNTER — Other Ambulatory Visit (HOSPITAL_COMMUNITY): Payer: Self-pay

## 2022-07-02 ENCOUNTER — Other Ambulatory Visit (HOSPITAL_COMMUNITY): Payer: Self-pay

## 2022-07-13 ENCOUNTER — Ambulatory Visit: Payer: BC Managed Care – PPO | Admitting: Behavioral Health

## 2022-07-13 DIAGNOSIS — F4321 Adjustment disorder with depressed mood: Secondary | ICD-10-CM | POA: Diagnosis not present

## 2022-07-13 DIAGNOSIS — F411 Generalized anxiety disorder: Secondary | ICD-10-CM | POA: Diagnosis not present

## 2022-07-13 NOTE — Progress Notes (Unsigned)
[facility] Counselor/Therapist Progress Note  Patient ID: Rowan Blaker, MRN: [id number],    Date: 07/13/2022  Time Spent: 60 minutes   Treatment Type: Psychotherapy  Reported Symptoms: Anxiety  Mental Status Exam:  Appearance:   Well Groomed     Behavior:  Appropriate  Motor:  Normal  Speech/Language:   Clear and Coherent  Affect:  Appropriate  Mood:  anxious  Thought process:  normal  Thought content:    WNL  Sensory/Perceptual disturbances:    WNL  Orientation:  oriented to person  Attention:  Good  Concentration:  Good  Memory:  WNL  Fund of knowledge:   Good  Insight:    Good  Judgment:   Good  Impulse Control:  Good   Risk Assessment: Danger to Self:  No Self-injurious Behavior: No Danger to Others: No Duty to Warn:no Physical Aggression / Violence:No  Access to Firearms a concern: No  Gang Involvement:No   Subjective: The patient presents as a referral from another therapist with [facility]. The patient introduced himself to the therapist and states intentions to return to his previous therapist whom is currently on leave. The patient reports since his last therapy session his father in law has died and his mother in law has been diagnosed with breast cancer. He reports he is prescribed Lamictal and Bupropion and states he is adhering to his prescribed medication regimen. He states his medication is helping. He denied having any acute concerns to address in therapy currently. 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   He reports having surgery on his knees and states he has not been able to be as supportive as he would like with his wife. He identified his emotional connection to the passing of his father in law as attempting to help his wife by being supportive as much as possible. The holidays are approaching and he states it is a challenge for his family. The patient reports his  moods are stable. 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 He states plans to take a three month sabbatical with his family. 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 However, he states "I am determined another sabbatical will not go that way". He is anticipating what his year will look like and questioning if it will be filled with financial changes. He states if his associate pastor leaves he has concerns about his future. He states to experience trust issues.   He reports to "Have a habit of putting a lot of load on me and then I carry it and do it pretty well". The patient reports to over commit himself to a variety of things. He states he struggles to relax. He reports plans to put measures in place to make time at home for his family. He identified his take away from today's session as "Thinking about how to empower and support more leadership to the entire staff that will help get the church ready and make me feel better about where we stand. I probably need to find something in particular to let drop and carve out sometime for family stuff". The patient denied SI/HI.   The counselor introduced herself to the patient. The counselor discussed the patients current mental health symptoms and mood stability. The counselor questioned the patients adherence to his prescribed  medication regimen. The counselor discussed irrational automatic thoughts. The counselor educated the patient on anticipatory anxiety. The counselor explored the patients trust issues with his current staff. The counselor provided assistance to the patient with problem solving and exploring alternatives to issues he is currently experiencing. The counselor assessed for SI/HI. The counselor educated the patient on crisis resource tools to utilize if needed.   Interventions: Cognitive Behavioral Therapy  and Solution Focused Brief Therapy   Diagnosis:   ICD-10-CM   1. Generalized anxiety disorder  F41.1     2. Grief reaction  F43.21       Plan:   Client to return for weekly therapy with [name], therapist, to review again in 6 months.  Client to engage in positive self talk and challenging negative internal ruminations and self talk causing client to be overly anxious and worried using CBT, on daily practice. Client to engage in mindfulness: ie body scans each eveneing to help process and discharge emotional distress & recognize emotions. Client to utilize BSP (brainspotting) with therapist to help client regulate their anxiety in a somatic- felt body sense way: (ie by working to reduce muscle tension, ruminations, increased heart rate, constant worrying and feeling "hyper" ) by decreasing anxiety by 33% in the next 6 months.  Client to prioritize sleep 8+ hours each week night AEB going to bed by 10pm each night.  Client participated in the treatment planning of their therapy.   Clydia Llano, [facility]

## 2022-07-15 ENCOUNTER — Encounter: Payer: Self-pay | Admitting: Behavioral Health

## 2022-07-30 ENCOUNTER — Other Ambulatory Visit (HOSPITAL_COMMUNITY): Payer: Self-pay

## 2022-07-30 DIAGNOSIS — E785 Hyperlipidemia, unspecified: Secondary | ICD-10-CM | POA: Diagnosis not present

## 2022-07-30 DIAGNOSIS — R7303 Prediabetes: Secondary | ICD-10-CM | POA: Diagnosis not present

## 2022-07-30 DIAGNOSIS — Z125 Encounter for screening for malignant neoplasm of prostate: Secondary | ICD-10-CM | POA: Diagnosis not present

## 2022-07-30 DIAGNOSIS — E538 Deficiency of other specified B group vitamins: Secondary | ICD-10-CM | POA: Diagnosis not present

## 2022-07-30 DIAGNOSIS — E559 Vitamin D deficiency, unspecified: Secondary | ICD-10-CM | POA: Diagnosis not present

## 2022-07-30 DIAGNOSIS — I10 Essential (primary) hypertension: Secondary | ICD-10-CM | POA: Diagnosis not present

## 2022-08-05 ENCOUNTER — Other Ambulatory Visit (HOSPITAL_COMMUNITY): Payer: Self-pay

## 2022-08-06 DIAGNOSIS — R82998 Other abnormal findings in urine: Secondary | ICD-10-CM | POA: Diagnosis not present

## 2022-08-06 DIAGNOSIS — Z1389 Encounter for screening for other disorder: Secondary | ICD-10-CM | POA: Diagnosis not present

## 2022-08-06 DIAGNOSIS — Z0000 Encounter for general adult medical examination without abnormal findings: Secondary | ICD-10-CM | POA: Diagnosis not present

## 2022-08-06 DIAGNOSIS — I10 Essential (primary) hypertension: Secondary | ICD-10-CM | POA: Diagnosis not present

## 2022-08-06 DIAGNOSIS — Z1331 Encounter for screening for depression: Secondary | ICD-10-CM | POA: Diagnosis not present

## 2022-08-11 ENCOUNTER — Other Ambulatory Visit (HOSPITAL_COMMUNITY): Payer: Self-pay

## 2022-08-11 ENCOUNTER — Other Ambulatory Visit (HOSPITAL_BASED_OUTPATIENT_CLINIC_OR_DEPARTMENT_OTHER): Payer: Self-pay

## 2022-08-11 DIAGNOSIS — E559 Vitamin D deficiency, unspecified: Secondary | ICD-10-CM | POA: Diagnosis not present

## 2022-08-11 DIAGNOSIS — G4733 Obstructive sleep apnea (adult) (pediatric): Secondary | ICD-10-CM | POA: Diagnosis not present

## 2022-08-11 DIAGNOSIS — I10 Essential (primary) hypertension: Secondary | ICD-10-CM | POA: Diagnosis not present

## 2022-08-11 DIAGNOSIS — E88819 Insulin resistance, unspecified: Secondary | ICD-10-CM | POA: Diagnosis not present

## 2022-08-11 MED ORDER — WEGOVY 2.4 MG/0.75ML ~~LOC~~ SOAJ
2.4000 mg | SUBCUTANEOUS | 1 refills | Status: DC
  Filled 2022-08-11: qty 3, 28d supply, fill #0
  Filled 2022-09-02: qty 3, 28d supply, fill #1

## 2022-08-11 MED ORDER — ERGOCALCIFEROL 1.25 MG (50000 UT) PO CAPS
1.0000 | ORAL_CAPSULE | ORAL | 2 refills | Status: AC
  Filled 2022-08-11: qty 4, 28d supply, fill #0
  Filled 2022-11-04: qty 4, 28d supply, fill #1
  Filled 2022-11-29: qty 4, 28d supply, fill #2

## 2022-08-17 ENCOUNTER — Ambulatory Visit: Payer: BC Managed Care – PPO | Admitting: Behavioral Health

## 2022-08-17 DIAGNOSIS — F411 Generalized anxiety disorder: Secondary | ICD-10-CM

## 2022-08-17 NOTE — Progress Notes (Unsigned)
[facility] Counselor/Therapist Progress Note  Patient ID: Corey Martin, MRN: [id number],    Date: 08/17/2022  Time Spent: 60 minutes   Treatment Type: Individual Therapy  Reported Symptoms: Anxiety   Mental Status Exam:  Appearance:   Well Groomed     Behavior:  Appropriate and Sharing  Motor:  Normal  Speech/Language:   Clear and Coherent  Affect:  Appropriate and Congruent  Mood:  anxious and sad  Thought process:  normal  Thought content:    WNL  Sensory/Perceptual disturbances:    WNL  Orientation:  oriented to person  Attention:  Good  Concentration:  Good  Memory:  WNL  Fund of knowledge:   Good  Insight:    Good  Judgment:   Good  Impulse Control:  Good   Risk Assessment: Danger to Self:  No Self-injurious Behavior: No Danger to Others: No Duty to Warn:no Physical Aggression / Violence:No  Access to Firearms a concern: No  Gang Involvement:No   Subjective:   The patient reports doing "Not great I am in a lower spot than usual" due to work. He states he is working more hours and has endured recent criticism from a Psychologist, educational. 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 He states belief that things will have a positive outcome. He reports to experience seasonal affect with his moods. He states he worked 65 hours last week and has been unable to practice self care. He reports he has been short with his family and states feeling disconnected at this time with them. He states to enjoy  physical therapy for his knee recovery. He attends physical therapy twice a week and states he is pleased with how his recovery is progressing. He expressed gratitude that he has lost 30 pounds since the summer. He reports receiving support from church and identified himself as a people pleaser. However, he understands he cannot please everyone. He expressed concern of his sabbatical soon approaching and would like for things to go smooth  while he is away from his church. He states concerns of having a negative experience in the past when taking a sabbatical. He identified today's take away as reframing the negative situation of experiencing criticism to avoid personalization and begin practicing self care. The patient denied SI/HI, AH/VH.   The counselor questioned the patients mental health symptoms. The counselor discussed practicing self care and questioned the patients self care methods. The counselor requested for the patient to identify the pros and cons of taking a sabbatical this year versus his sabbatical in the past to assist the patient with reframing his thoughts. The counselor encouraged the patient to refram automatic negative thoughts and utilize positive self talk.The counselor assessed for SI/HI, AH/VH.   Interventions: Cognitive Behavioral Therapy  Diagnosis:   ICD-10-CM   1. Generalized anxiety disorder  F41.1       Plan:   Client to return for weekly therapy with Corey Martin, therapist, to review again in 6 months.  Client to engage in positive self talk and challenging negative internal ruminations and self talk causing client to be overly anxious and worried using CBT, on daily practice. Client to engage in mindfulness: ie body scans each eveneing to help process and discharge emotional distress & recognize emotions. Client to utilize BSP (brainspotting) with therapist to help client regulate their anxiety in a somatic- felt body sense way: (ie by working to reduce muscle tension, ruminations, increased  heart rate, constant worrying and feeling "hyper" ) by decreasing anxiety by 33% in the next 6 months.  Client to prioritize sleep 8+ hours each week night AEB going to bed by 10pm each night.  Client participated in the treatment planning of their therapy.  Jannifer Hick, [facility]

## 2022-08-19 ENCOUNTER — Encounter: Payer: Self-pay | Admitting: Behavioral Health

## 2022-09-02 ENCOUNTER — Other Ambulatory Visit (HOSPITAL_COMMUNITY): Payer: Self-pay

## 2022-09-14 ENCOUNTER — Ambulatory Visit: Payer: BC Managed Care – PPO | Admitting: Behavioral Health

## 2022-09-30 ENCOUNTER — Other Ambulatory Visit: Payer: Self-pay | Admitting: Cardiology

## 2022-09-30 DIAGNOSIS — E7800 Pure hypercholesterolemia, unspecified: Secondary | ICD-10-CM

## 2022-10-04 ENCOUNTER — Encounter: Payer: Self-pay | Admitting: Addiction (Substance Use Disorder)

## 2022-10-05 ENCOUNTER — Other Ambulatory Visit (HOSPITAL_COMMUNITY): Payer: Self-pay

## 2022-10-05 ENCOUNTER — Other Ambulatory Visit: Payer: Self-pay

## 2022-10-05 MED ORDER — WEGOVY 2.4 MG/0.75ML ~~LOC~~ SOAJ
2.4000 mg | SUBCUTANEOUS | 1 refills | Status: AC
  Filled 2022-10-05: qty 3, 28d supply, fill #0

## 2022-10-07 DIAGNOSIS — G4733 Obstructive sleep apnea (adult) (pediatric): Secondary | ICD-10-CM | POA: Diagnosis not present

## 2022-10-07 DIAGNOSIS — I10 Essential (primary) hypertension: Secondary | ICD-10-CM | POA: Diagnosis not present

## 2022-10-07 DIAGNOSIS — E88819 Insulin resistance, unspecified: Secondary | ICD-10-CM | POA: Diagnosis not present

## 2022-10-11 ENCOUNTER — Other Ambulatory Visit (HOSPITAL_COMMUNITY): Payer: Self-pay

## 2022-10-11 MED ORDER — WEGOVY 2.4 MG/0.75ML ~~LOC~~ SOAJ
SUBCUTANEOUS | 0 refills | Status: DC
  Filled 2022-10-11 – 2022-11-04 (×2): qty 3, 28d supply, fill #0

## 2022-10-14 ENCOUNTER — Ambulatory Visit (INDEPENDENT_AMBULATORY_CARE_PROVIDER_SITE_OTHER): Payer: BC Managed Care – PPO | Admitting: Behavioral Health

## 2022-10-14 DIAGNOSIS — F411 Generalized anxiety disorder: Secondary | ICD-10-CM | POA: Diagnosis not present

## 2022-10-14 NOTE — Progress Notes (Signed)
[facility] Counselor/Therapist Progress Note  Patient ID: Corey Martin, MRN: [id number],    Date: 10/14/2022  Time Spent: 60 minutes   Treatment Type: Psychotherapy  Reported Symptoms: Anxiety   Mental Status Exam:  Appearance:   Casual     Behavior:  Appropriate and Sharing  Motor:  Normal  Speech/Language:   Clear and Coherent  Affect:  Appropriate and Congruent  Mood:  anxious  Thought process:  normal  Thought content:    WNL  Sensory/Perceptual disturbances:    WNL  Orientation:  oriented to person  Attention:  Good  Concentration:  Good  Memory:  WNL  Fund of knowledge:   Good  Insight:    Good  Judgment:   Good  Impulse Control:  Good   Risk Assessment: Danger to Self:  No Self-injurious Behavior: No Danger to Others: No Duty to Warn:no Physical Aggression / Violence:No  Access to Firearms a concern: No  Gang Involvement:No   Subjective:   The patient reports since last being seen he "Have been extraordinarily busy". He states he continues to plan for his summer sabbatical he states experiencing more than what he expected at work. He states he has not been available at home with his family as desired due to work responsibilities. He rated anxiety at a 5 today on a scale of 0 to 10 with 10 being severe. The patient reports feeling scattered. He described depression as "I am not depressed". He states there are daily tasks he would like to accomplish, however he has not been able to get the task done due to various additional work related duties. He reports he has been delegating responsibilities to other staff. He reports planning to do things and states other things come up such as sitting in on meetings that he has not planned to attend. He states "I feel like I need to be there for those conversations". The patient states to have trust issues with others completing responsibilities. The patient states having troubling trusting others to make decisions.     The patient reports interest with engaging in his hobby of photography and hiking to prepare for his family trip, however he states he has not been able to do these things due to work. He reports his physical therapy is coming to an end and he states he does not want it to stop. He expressed interest with possibly incorporating things he is interested in once his physical therapy stop. He reports he is on a nutrition meal plan, however he states he has not been successful with following it when he is not at home. He states he is on medication to help with managing his weight. He states to have trouble sleeping. He reports belief that having racing thoughts contribute to his trouble sleeping. He reports to receive approximately six to seven hours of sleep.  The patient expressed gratitude with having great social relationship connections with other pastors to help with his emotional wellbeing. The patient identified his take away from today's session as "Easing off the reigns and being in the background and letting other people take responsibility and find something to replace responsibility". He was informed this counselors time is limited with [facility]. He reported no interest with continuing to receive therapy with [facility]. He was provided an outside referral list of local counseling agencies to connect and engage with to continue receive psychotherapy.     Counselor conducted check in. Counselor utilized reflective listening and  validated the patient. Counselor discussed irrational thinking to include emotional reasoning. Counselor discussed reframing negative thoughts. Counselor discussed utilizing positive self talk. Counselor provided assistance to the patient with problem solving how to delegate responsibility to his staff before he leaves for his trip. Counselor discussed mental health symptoms and requested for the patient to rate the symptoms. Counselor assessed for SI/HI,  AH/VH. Counselor informed the patient her time is limited with [facility] and questioned his interest with continuing with therapy.  Counselor provided the patient with outside counseling referral information.  Interventions: Cognitive Behavioral Therapy and Solution-Oriented/Positive Psychology  Diagnosis:   ICD-10-CM   1. Generalized anxiety disorder  F41.1       Plan:   Client to return for weekly therapy with Sammuel Cooper, therapist, to review again in 6 months.  Client to engage in positive self talk and challenging negative internal ruminations and self talk causing client to be overly anxious and worried using CBT, on daily practice. Client to engage in mindfulness: ie body scans each eveneing to help process and discharge emotional distress & recognize emotions. Client to utilize BSP (brainspotting) with therapist to help client regulate their anxiety in a somatic- felt body sense way: (ie by working to reduce muscle tension, ruminations, increased heart rate, constant worrying and feeling "hyper" ) by decreasing anxiety by 33% in the next 6 months.  Client to prioritize sleep 8+ hours each week night AEB going to bed by 10pm each night.  Client participated in the treatment planning of their therapy.     Jannifer Hick, [facility]

## 2022-10-15 ENCOUNTER — Encounter: Payer: Self-pay | Admitting: Behavioral Health

## 2022-10-21 ENCOUNTER — Ambulatory Visit (INDEPENDENT_AMBULATORY_CARE_PROVIDER_SITE_OTHER): Payer: BC Managed Care – PPO | Admitting: Behavioral Health

## 2022-10-21 ENCOUNTER — Encounter: Payer: Self-pay | Admitting: Behavioral Health

## 2022-10-21 DIAGNOSIS — F4323 Adjustment disorder with mixed anxiety and depressed mood: Secondary | ICD-10-CM

## 2022-10-21 DIAGNOSIS — F3181 Bipolar II disorder: Secondary | ICD-10-CM | POA: Diagnosis not present

## 2022-10-21 MED ORDER — BUPROPION HCL ER (XL) 300 MG PO TB24: 300.0000 mg | ORAL_TABLET | Freq: Every day | ORAL | 1 refills | Status: DC

## 2022-10-21 MED ORDER — LAMOTRIGINE 100 MG PO TABS: 100.0000 mg | ORAL_TABLET | Freq: Every day | ORAL | 1 refills | Status: DC

## 2022-10-21 NOTE — Progress Notes (Signed)
[facility] Med Check  Patient ID: Corey Martin,  MRN: [id number]  PCP: Sueanne Margarita, DO  Date of Evaluation: 10/21/2022 Time spent:30 minutes  Chief Complaint:  Chief Complaint   Anxiety; Depression; Follow-up; Medication Refill; Patient Education     HISTORY/CURRENT STATUS: HPI  47 year old presents to this office for follow up and for medication management.He says he is very happy with how his medications continue to work. He says there is no need in trying to adjust meds right now. He says he is content and in good place.  Says his anxiety level today is 1/10 and depression at 1/10. Says he is sleeping 7-8 hours per night. No mania, No psychosis, No SI/HI. He will continue in psychotherapy. Requesting 6 mo f/u.    No prior psychiatric medication failures noted  Individual Medical History/ Review of Systems: Changes? :No   Allergies: Patient has no known allergies.  Current Medications:  Current Outpatient Medications:    buPROPion (WELLBUTRIN XL) 300 MG 24 hr tablet, Take 1 tablet (300 mg total) by mouth daily. Needs office visit, Disp: 90 tablet, Rfl: 1   Choline Fenofibrate (FENOFIBRIC ACID) 135 MG CPDR, TAKE 1 CAPSULE BY MOUTH EVERY DAY, Disp: 90 capsule, Rfl: 1   ergocalciferol (VITAMIN D2) 1.25 MG (50000 UT) capsule, Take 1 capsule by mouth Once a week, Disp: 4 capsule, Rfl: 2   ergocalciferol (VITAMIN D2) 1.25 MG (50000 UT) capsule, Take 1 capsule (50,000 Units total) by mouth once a week., Disp: 4 capsule, Rfl: 2   ergocalciferol (VITAMIN D2) 1.25 MG (50000 UT) capsule, Take 1 capsule (50,000 Units total) by mouth once a week., Disp: 4 capsule, Rfl: 2   ergocalciferol (VITAMIN D2) 1.25 MG (50000 UT) capsule, Take 1 capsule (50,000 Units total) by mouth once a week., Disp: 4 capsule, Rfl: 2   Insulin Pen Needle 31G X 8 MM MISC, Use as directed, Disp: 100 each, Rfl: 3   lamoTRIgine (LAMICTAL) 100 MG tablet, Take 1 tablet (100 mg total) by mouth daily., Disp: 90  tablet, Rfl: 1   LORazepam (ATIVAN) 0.5 MG tablet, Take 1 tablet (0.5 mg total) by mouth every 8 (eight) hours as needed for anxiety., Disp: 30 tablet, Rfl: 0   metFORMIN (GLUCOPHAGE) 500 MG tablet, TAKE 1 TABLET (500 MG TOTAL) BY MOUTH 2 (TWO) TIMES DAILY WITH A MEAL. NO RF UNTIL OV, Disp: 120 tablet, Rfl: 0   olmesartan-hydrochlorothiazide (BENICAR HCT) 40-12.5 MG tablet, TAKE 1 TABLET BY MOUTH EVERY DAY IN THE MORNING, Disp: 90 tablet, Rfl: 3   rosuvastatin (CRESTOR) 10 MG tablet, TAKE 1 TABLET BY MOUTH EVERY DAY, Disp: 30 tablet, Rfl: 0   Semaglutide-Weight Management (WEGOVY) 0.25 MG/0.5ML SOAJ, Inject 0.25mg  subcutaneously weekly, Disp: 2 mL, Rfl: 0   Semaglutide-Weight Management (WEGOVY) 0.5 MG/0.5ML SOAJ, Inject 0.5mg  under the skin weekly, Disp: 2 mL, Rfl: 0   Semaglutide-Weight Management (WEGOVY) 1 MG/0.5ML SOAJ, Inject 1 mg into the skin once a week., Disp: 2 mL, Rfl: 0   Semaglutide-Weight Management (WEGOVY) 1.7 MG/0.75ML SOAJ, Inject 1.7 mg (1 pen) into the skin once a week., Disp: 3 mL, Rfl: 0   Semaglutide-Weight Management (WEGOVY) 2.4 MG/0.75ML SOAJ, Inject 2.4 mg into the skin once a week., Disp: 3 mL, Rfl: 1   Semaglutide-Weight Management (WEGOVY) 2.4 MG/0.75ML SOAJ, Inject 2.4mg  under the skin once weekly, Disp: 3 mL, Rfl: 0   vitamin B-12 (CYANOCOBALAMIN) 1000 MCG tablet, Take 1 tablet (1,000 mcg total) by mouth daily., Disp:  , Rfl:  Vitamin D, Ergocalciferol, (DRISDOL) 1.25 MG (50000 UNIT) CAPS capsule, Take 1 capsule (50,000 Units total) by mouth every 7 (seven) days., Disp: 12 capsule, Rfl: 1 Medication Side Effects: none  Family Medical/ Social History: Changes? No  MENTAL HEALTH EXAM:  There were no vitals taken for this visit.There is no height or weight on file to calculate BMI.  General Appearance: Casual and Well Groomed  Eye Contact:  Good  Speech:  Clear and Coherent  Volume:  Normal  Mood:  Anxious and Depressed  Affect:  Appropriate  Thought Process:   Coherent  Orientation:  Full (Time, Place, and Person)  Thought Content: Logical   Suicidal Thoughts:  No  Homicidal Thoughts:  No  Memory:  WNL  Judgement:  Good  Insight:  Good  Psychomotor Activity:  Normal  Concentration:  Concentration: Good  Recall:  Good  Fund of Knowledge: Good  Language: Good  Assets:  Desire for Improvement  ADL's:  Intact  Cognition: WNL  Prognosis:  Good    DIAGNOSES:    ICD-10-CM   1. Bipolar II disorder (HCC)  F31.81 lamoTRIgine (LAMICTAL) 100 MG tablet    2. Mood D/o   F43.23 buPROPion (WELLBUTRIN XL) 300 MG 24 hr tablet      Receiving Psychotherapy: No    RECOMMENDATIONS:  Greater than 50% of 30 min. face to face time with patient was spent on counseling and coordination of care. We discussed his continued stability and he is very happy with how his medications continue to work well.  He feels like he is in a good place now.     Continue on Lamictal 100 mg daily Continue on Wellbutrin 300 mg daily Not taking  0.5 Ativan 3 times daily PRN To report worsening symptoms promptly Provided emergency contact information Will report any worsening symptoms or changes promptly Reviewed [place], NP

## 2022-11-04 ENCOUNTER — Other Ambulatory Visit (HOSPITAL_COMMUNITY): Payer: Self-pay

## 2022-11-29 ENCOUNTER — Other Ambulatory Visit (HOSPITAL_COMMUNITY): Payer: Self-pay

## 2022-11-29 ENCOUNTER — Other Ambulatory Visit: Payer: Self-pay

## 2022-11-29 MED ORDER — WEGOVY 2.4 MG/0.75ML ~~LOC~~ SOAJ
2.4000 mg | SUBCUTANEOUS | 0 refills | Status: AC
  Filled 2022-11-29: qty 3, 28d supply, fill #0

## 2022-12-02 ENCOUNTER — Other Ambulatory Visit (HOSPITAL_COMMUNITY): Payer: Self-pay

## 2022-12-02 DIAGNOSIS — G4733 Obstructive sleep apnea (adult) (pediatric): Secondary | ICD-10-CM | POA: Diagnosis not present

## 2022-12-02 DIAGNOSIS — I10 Essential (primary) hypertension: Secondary | ICD-10-CM | POA: Diagnosis not present

## 2022-12-02 DIAGNOSIS — E559 Vitamin D deficiency, unspecified: Secondary | ICD-10-CM | POA: Diagnosis not present

## 2022-12-02 DIAGNOSIS — E88819 Insulin resistance, unspecified: Secondary | ICD-10-CM | POA: Diagnosis not present

## 2022-12-02 MED ORDER — WEGOVY 2.4 MG/0.75ML ~~LOC~~ SOAJ
SUBCUTANEOUS | 1 refills | Status: AC
  Filled 2022-12-02: qty 9, 84d supply, fill #0
  Filled 2022-12-28: qty 3, 28d supply, fill #0
  Filled 2023-01-31: qty 3, 28d supply, fill #1
  Filled 2023-03-09 – 2023-04-18 (×2): qty 3, 28d supply, fill #2

## 2022-12-19 ENCOUNTER — Other Ambulatory Visit: Payer: Self-pay | Admitting: Cardiology

## 2022-12-19 DIAGNOSIS — E7800 Pure hypercholesterolemia, unspecified: Secondary | ICD-10-CM

## 2022-12-28 ENCOUNTER — Other Ambulatory Visit (HOSPITAL_COMMUNITY): Payer: Self-pay

## 2022-12-30 ENCOUNTER — Other Ambulatory Visit (HOSPITAL_COMMUNITY): Payer: Self-pay

## 2022-12-30 MED ORDER — OLMESARTAN MEDOXOMIL-HCTZ 40-25 MG PO TABS
1.0000 | ORAL_TABLET | Freq: Every day | ORAL | 0 refills | Status: DC
  Filled 2022-12-30: qty 30, 30d supply, fill #0

## 2023-01-31 ENCOUNTER — Other Ambulatory Visit (HOSPITAL_COMMUNITY): Payer: Self-pay

## 2023-01-31 MED ORDER — OLMESARTAN MEDOXOMIL-HCTZ 40-25 MG PO TABS
1.0000 | ORAL_TABLET | Freq: Every day | ORAL | 0 refills | Status: DC
  Filled 2023-01-31: qty 30, 30d supply, fill #0

## 2023-02-01 ENCOUNTER — Other Ambulatory Visit (HOSPITAL_COMMUNITY): Payer: Self-pay

## 2023-03-09 ENCOUNTER — Other Ambulatory Visit (HOSPITAL_COMMUNITY): Payer: Self-pay

## 2023-03-09 ENCOUNTER — Other Ambulatory Visit (HOSPITAL_BASED_OUTPATIENT_CLINIC_OR_DEPARTMENT_OTHER): Payer: Self-pay

## 2023-03-10 ENCOUNTER — Other Ambulatory Visit (HOSPITAL_COMMUNITY): Payer: Self-pay

## 2023-03-13 ENCOUNTER — Other Ambulatory Visit: Payer: Self-pay | Admitting: Behavioral Health

## 2023-03-13 DIAGNOSIS — F3181 Bipolar II disorder: Secondary | ICD-10-CM

## 2023-03-14 ENCOUNTER — Other Ambulatory Visit (HOSPITAL_COMMUNITY): Payer: Self-pay

## 2023-03-14 DIAGNOSIS — E559 Vitamin D deficiency, unspecified: Secondary | ICD-10-CM | POA: Diagnosis not present

## 2023-03-14 DIAGNOSIS — I10 Essential (primary) hypertension: Secondary | ICD-10-CM | POA: Diagnosis not present

## 2023-03-14 DIAGNOSIS — G4733 Obstructive sleep apnea (adult) (pediatric): Secondary | ICD-10-CM | POA: Diagnosis not present

## 2023-03-14 DIAGNOSIS — E88819 Insulin resistance, unspecified: Secondary | ICD-10-CM | POA: Diagnosis not present

## 2023-03-14 MED ORDER — OLMESARTAN MEDOXOMIL-HCTZ 40-25 MG PO TABS
1.0000 | ORAL_TABLET | Freq: Every day | ORAL | 1 refills | Status: AC
  Filled 2023-04-18: qty 90, 90d supply, fill #0

## 2023-03-14 MED ORDER — VITAMIN D (ERGOCALCIFEROL) 1.25 MG (50000 UNIT) PO CAPS
50000.0000 [IU] | ORAL_CAPSULE | ORAL | 2 refills | Status: AC
  Filled 2023-03-14: qty 4, 28d supply, fill #0
  Filled 2023-04-18: qty 4, 28d supply, fill #1

## 2023-03-14 MED ORDER — WEGOVY 2.4 MG/0.75ML ~~LOC~~ SOAJ
2.4000 mg | SUBCUTANEOUS | 1 refills | Status: AC
Start: 2023-03-14 — End: ?
  Filled 2023-03-14 – 2023-03-16 (×2): qty 3, 28d supply, fill #0

## 2023-03-14 MED ORDER — VITAMIN D (ERGOCALCIFEROL) 1.25 MG (50000 UNIT) PO CAPS: 50000.0000 [IU] | ORAL_CAPSULE | ORAL | 2 refills | Status: AC

## 2023-03-14 MED ORDER — OLMESARTAN MEDOXOMIL-HCTZ 40-25 MG PO TABS
1.0000 | ORAL_TABLET | Freq: Every day | ORAL | 0 refills | Status: DC
  Filled 2023-03-14: qty 30, 30d supply, fill #0

## 2023-03-15 DIAGNOSIS — F411 Generalized anxiety disorder: Secondary | ICD-10-CM | POA: Diagnosis not present

## 2023-03-16 ENCOUNTER — Other Ambulatory Visit (HOSPITAL_COMMUNITY): Payer: Self-pay

## 2023-03-22 DIAGNOSIS — F411 Generalized anxiety disorder: Secondary | ICD-10-CM | POA: Diagnosis not present

## 2023-03-30 ENCOUNTER — Other Ambulatory Visit (HOSPITAL_COMMUNITY): Payer: Self-pay

## 2023-04-12 DIAGNOSIS — F411 Generalized anxiety disorder: Secondary | ICD-10-CM | POA: Diagnosis not present

## 2023-04-18 ENCOUNTER — Other Ambulatory Visit (HOSPITAL_COMMUNITY): Payer: Self-pay

## 2023-04-22 ENCOUNTER — Ambulatory Visit: Payer: BC Managed Care – PPO | Admitting: Behavioral Health

## 2023-04-25 ENCOUNTER — Ambulatory Visit: Payer: BC Managed Care – PPO | Admitting: Physician Assistant

## 2023-04-25 ENCOUNTER — Encounter: Payer: Self-pay | Admitting: Physician Assistant

## 2023-04-25 DIAGNOSIS — F4323 Adjustment disorder with mixed anxiety and depressed mood: Secondary | ICD-10-CM

## 2023-04-25 DIAGNOSIS — G4700 Insomnia, unspecified: Secondary | ICD-10-CM | POA: Diagnosis not present

## 2023-04-25 DIAGNOSIS — F3181 Bipolar II disorder: Secondary | ICD-10-CM | POA: Diagnosis not present

## 2023-04-25 DIAGNOSIS — F411 Generalized anxiety disorder: Secondary | ICD-10-CM | POA: Diagnosis not present

## 2023-04-25 MED ORDER — ALPRAZOLAM 0.5 MG PO TABS: 0.2500 mg | ORAL_TABLET | Freq: Two times a day (BID) | ORAL | 0 refills | Status: DC | PRN

## 2023-04-25 MED ORDER — BUPROPION HCL ER (XL) 300 MG PO TB24
300.0000 mg | ORAL_TABLET | Freq: Every day | ORAL | 1 refills | Status: DC
Start: 2023-04-25 — End: 2023-10-27

## 2023-04-25 MED ORDER — LAMOTRIGINE 150 MG PO TABS: 150.0000 mg | ORAL_TABLET | Freq: Every day | ORAL | 1 refills | Status: DC

## 2023-04-25 NOTE — Progress Notes (Signed)
[facility] Med Check  Patient ID: Corey Martin,  MRN: [id number]  PCP: Charlane Ferretti, DO  Date of Evaluation: 04/25/2023 Time spent:20 minutes  Chief Complaint:  Chief Complaint   Anxiety; Depression; Insomnia    HISTORY/CURRENT STATUS: HPI Corey Martin is a pt of Avelina Laine NP, who I'm seeing in his absence.  Corey Martin has had a rough few months. 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  He had a lot of stress with a cross-country trip, while things were going on at church that he had no control over.  There may be some big changes at church due to funds.  When he and his family were on vacation he was very irritable at times and even had a "meltdown" in [place] over the summer.  States that it should have been a special trip with his family but instead it turned into him being a grumpy dad and husband.  He has not been sleeping well for several months.  He cannot get his mind to turn off.  He is thinking about many things that he cannot control, which prevents him from going to sleep.  Then if he does, then wakes up, he has a hard time going back to sleep.  Melatonin is not helpful.  He thinks that may be a part of the reason for some of the irritability now.  The short fuse has improved since he has been back home and at work though.  He has had a couple of panic attacks, the Ativan does not help at all.  He does not want to get addicted to that so he hasn't taken it much.  Has been under a lot of stress with his mother-in-law who lived with them for a while but is now in a rehab facility.  Now he is dealing more with depression.  States he feels "down."  Hopeless at times.  Lacks energy and motivation although he is not missing work.  Not crying easily.  ADLs and personal hygiene are normal.  Appetite is normal and weight is stable he is on Wegovy and has lost some weight since being on it.  No suicidal or homicidal thoughts.   Denies dizziness, syncope,  seizures, numbness, tingling, tremor, tics, unsteady gait, slurred speech, confusion. Denies muscle or joint pain, stiffness, or dystonia.   Individual Medical History/ Review of Systems: Changes? :No   Allergies: Patient has no known allergies.  Current Medications:  Current Outpatient Medications:    Choline Fenofibrate (FENOFIBRIC ACID) 135 MG CPDR, TAKE 1 CAPSULE BY MOUTH EVERY DAY, Disp: 90 capsule, Rfl: 1   ergocalciferol (VITAMIN D2) 1.25 MG (50000 UT) capsule, Take 1 capsule by mouth Once a week, Disp: 4 capsule, Rfl: 2   ergocalciferol (VITAMIN D2) 1.25 MG (50000 UT) capsule, Take 1 capsule (50,000 Units total) by mouth once a week., Disp: 4 capsule, Rfl: 2   ergocalciferol (VITAMIN D2) 1.25 MG (50000 UT) capsule, Take 1 capsule (50,000 Units total) by mouth once a week., Disp: 4 capsule, Rfl: 2   Insulin Pen Needle 31G X 8 MM MISC, Use as directed, Disp: 100 each, Rfl: 3   lamoTRIgine (LAMICTAL) 150 MG tablet, Take 1 tablet (150 mg total) by mouth daily., Disp: 30 tablet, Rfl: 1   metFORMIN (GLUCOPHAGE) 500 MG tablet, TAKE 1 TABLET (500 MG TOTAL) BY MOUTH 2 (TWO) TIMES DAILY WITH A MEAL. NO RF UNTIL OV, Disp: 120 tablet, Rfl: 0   olmesartan-hydrochlorothiazide (  BENICAR HCT) 40-12.5 MG tablet, TAKE 1 TABLET BY MOUTH EVERY DAY IN THE MORNING, Disp: 90 tablet, Rfl: 3   olmesartan-hydrochlorothiazide (BENICAR HCT) 40-25 MG tablet, Take 1 tablet by mouth daily., Disp: 30 tablet, Rfl: 0   olmesartan-hydrochlorothiazide (BENICAR HCT) 40-25 MG tablet, Take 1 tablet by mouth daily., Disp: 90 tablet, Rfl: 1   rosuvastatin (CRESTOR) 10 MG tablet, TAKE 1 TABLET BY MOUTH EVERY DAY, Disp: 30 tablet, Rfl: 0   Semaglutide-Weight Management (WEGOVY) 2.4 MG/0.75ML SOAJ, Inject 2.4 mg into the skin once a week., Disp: 3 mL, Rfl: 1   Semaglutide-Weight Management (WEGOVY) 2.4 MG/0.75ML SOAJ, Inject 2.4 mg into the skin every 7 (seven) days., Disp: 3 mL, Rfl: 0   Semaglutide-Weight Management (WEGOVY) 2.4  MG/0.75ML SOAJ, Inject 2.4mg  under the skin once weekly, Disp: 9 mL, Rfl: 1   Semaglutide-Weight Management (WEGOVY) 2.4 MG/0.75ML SOAJ, Inject 2.4 mg into the skin once a week., Disp: 9 mL, Rfl: 1   vitamin B-12 (CYANOCOBALAMIN) 1000 MCG tablet, Take 1 tablet (1,000 mcg total) by mouth daily., Disp:  , Rfl:    Vitamin D, Ergocalciferol, (DRISDOL) 1.25 MG (50000 UNIT) CAPS capsule, Take 1 capsule (50,000 Units total) by mouth every 7 (seven) days., Disp: 12 capsule, Rfl: 1   Vitamin D, Ergocalciferol, (DRISDOL) 1.25 MG (50000 UNIT) CAPS capsule, Take 1 capsule (50,000 Units total) by mouth once a week., Disp: 4 capsule, Rfl: 2   Vitamin D, Ergocalciferol, (DRISDOL) 1.25 MG (50000 UNIT) CAPS capsule, Take 1 capsule (50,000 Units total) by mouth once a week., Disp: 4 capsule, Rfl: 2   ALPRAZolam (XANAX) 0.5 MG tablet, Take 0.5-1 tablets (0.25-0.5 mg total) by mouth 2 (two) times daily as needed for anxiety., Disp: 60 tablet, Rfl: 0   buPROPion (WELLBUTRIN XL) 300 MG 24 hr tablet, Take 1 tablet (300 mg total) by mouth daily. Needs office visit, Disp: 90 tablet, Rfl: 1 Medication Side Effects: none  Family Medical/ Social History: Changes? Yes  Mother in law had a major cardiac event since LOV and is now in rehab in a nursing home.    MENTAL HEALTH EXAM:  There were no vitals taken for this visit.There is no height or weight on file to calculate BMI.  General Appearance: Casual, Well Groomed, and Obese  Eye Contact:  Good  Speech:  Clear and Coherent and Normal Rate  Volume:  Normal  Mood:  Depressed and Hopeless  Affect:  Congruent  Thought Process:  Goal Directed and Descriptions of Associations: Circumstantial  Orientation:  Full (Time, Place, and Person)  Thought Content: Logical   Suicidal Thoughts:  No  Homicidal Thoughts:  No  Memory:  WNL  Judgement:  Good  Insight:  Good  Psychomotor Activity:  Normal  Concentration:  Concentration: Good  Recall:  Good  Fund of Knowledge: Good   Language: Good  Assets:  Desire for Improvement Financial Resources/Insurance Housing Transportation Vocational/Educational  ADL's:  Intact  Cognition: WNL  Prognosis:  Good   DIAGNOSES:    ICD-10-CM   1. Bipolar II disorder (HCC)  F31.81     2. Mood D/o   F43.23 buPROPion (WELLBUTRIN XL) 300 MG 24 hr tablet    3. Insomnia, unspecified type  G47.00     4. Generalized anxiety disorder  F41.1      Receiving Psychotherapy: Yes   RECOMMENDATIONS:  PDMP reviewed. Oxycodone 05/03/2022, o/w neg.  I provided 20 minutes of face to face time during this encounter, including time spent before and after  the visit in records review, medical decision making, counseling pertinent to today's visit, and charting.   We discussed his symptoms and medication options.  I recommend increasing the Lamictal.  It has been effective for a long time at the current dose, however it is no longer efficacious.  Explained the rationale behind that.  I do not think increasing Wellbutrin is a good idea at this time because it may worsen the anxiety.  I recommend changing Ativan to Xanax because it will help with anxiety and hopefully the insomnia as well.  He understands and agrees with this plan.  Discontinue Ativan.  Start Xanax 0.5 mg, 1/2-1 p.o. twice daily as needed anxiety or sleep. Continue Wellbutrin XL 300 mg, 1 p.o. every morning. Increase Lamictal to 150 mg, 1 p.o. daily. Continue therapy. Return in 2 to 3 weeks to see Avelina Laine, NP.  Melony Overly, PA-C

## 2023-04-28 DIAGNOSIS — F411 Generalized anxiety disorder: Secondary | ICD-10-CM | POA: Diagnosis not present

## 2023-05-10 DIAGNOSIS — F411 Generalized anxiety disorder: Secondary | ICD-10-CM | POA: Diagnosis not present

## 2023-05-12 ENCOUNTER — Ambulatory Visit (INDEPENDENT_AMBULATORY_CARE_PROVIDER_SITE_OTHER): Payer: BC Managed Care – PPO | Admitting: Behavioral Health

## 2023-05-12 ENCOUNTER — Other Ambulatory Visit (HOSPITAL_COMMUNITY): Payer: Self-pay

## 2023-05-12 DIAGNOSIS — I10 Essential (primary) hypertension: Secondary | ICD-10-CM | POA: Diagnosis not present

## 2023-05-12 DIAGNOSIS — E559 Vitamin D deficiency, unspecified: Secondary | ICD-10-CM | POA: Diagnosis not present

## 2023-05-12 DIAGNOSIS — G4733 Obstructive sleep apnea (adult) (pediatric): Secondary | ICD-10-CM | POA: Diagnosis not present

## 2023-05-12 DIAGNOSIS — E88819 Insulin resistance, unspecified: Secondary | ICD-10-CM | POA: Diagnosis not present

## 2023-05-12 MED ORDER — WEGOVY 2.4 MG/0.75ML ~~LOC~~ SOAJ
2.4000 mg | SUBCUTANEOUS | 1 refills | Status: AC
  Filled 2023-05-12: qty 9, 84d supply, fill #0

## 2023-05-12 MED ORDER — VITAMIN D (ERGOCALCIFEROL) 1.25 MG (50000 UNIT) PO CAPS
50000.0000 [IU] | ORAL_CAPSULE | ORAL | 2 refills | Status: AC
  Filled 2023-05-12: qty 4, 28d supply, fill #0

## 2023-05-17 ENCOUNTER — Other Ambulatory Visit (HOSPITAL_COMMUNITY): Payer: Self-pay

## 2023-05-19 ENCOUNTER — Encounter: Payer: Self-pay | Admitting: Behavioral Health

## 2023-05-19 ENCOUNTER — Ambulatory Visit: Payer: BC Managed Care – PPO | Admitting: Behavioral Health

## 2023-05-19 DIAGNOSIS — F3181 Bipolar II disorder: Secondary | ICD-10-CM

## 2023-05-19 DIAGNOSIS — F411 Generalized anxiety disorder: Secondary | ICD-10-CM | POA: Diagnosis not present

## 2023-05-19 MED ORDER — LAMOTRIGINE 150 MG PO TABS
150.0000 mg | ORAL_TABLET | Freq: Every day | ORAL | 1 refills | Status: DC
Start: 2023-05-19 — End: 2023-10-27

## 2023-05-19 NOTE — Progress Notes (Signed)
[facility]  Patient ID: Corey Martin,  MRN: [id number]  PCP: Charlane Ferretti, DO  Date of Evaluation: 05/19/2023 Time spent:30 minutes  Chief Complaint:  Chief Complaint   Depression; Anxiety; Follow-up; Medication Refill; Patient Education; Stress     HISTORY/CURRENT STATUS: HPI 47 year old presents to this office for follow up and for medication management. He is upset and says there was some confusion about his appointment scheduled for 9/30 in my absence. He was able to see Melony Overly. Says that he was having a rough summer and experienced and emotional "meltdown" while in [place]. Says his job as [employer] can be very stressful.  His sleep quality has been poor but he does not have trouble falling asleep but rather waking up after 5-6 hours.  His Lamictal was increased to 150 mg and says he is feeling better. Less racing thoughts and irritability.  Says his situational stressors are calming which is also helping overall. He would like to consider medication to assist in sleep this visit.  Says his anxiety level today is 4/10 and depression at 4/10. Says he is sleeping 7-8 hours per night. No mania, No psychosis, No SI/HI. He will continue in psychotherapy. Requesting 6 mo f/u.    No prior psychiatric medication failures noted  Individual Medical History/ Review of Systems: Changes? :No   Allergies: Patient has no known allergies.  Current Medications:  Current Outpatient Medications:    ALPRAZolam (XANAX) 0.5 MG tablet, Take 0.5-1 tablets (0.25-0.5 mg total) by mouth 2 (two) times daily as needed for anxiety., Disp: 60 tablet, Rfl: 0   buPROPion (WELLBUTRIN XL) 300 MG 24 hr tablet, Take 1 tablet (300 mg total) by mouth daily. Needs office visit, Disp: 90 tablet, Rfl: 1   Choline Fenofibrate (FENOFIBRIC ACID) 135 MG CPDR, TAKE 1 CAPSULE BY MOUTH EVERY DAY, Disp: 90 capsule, Rfl: 1   ergocalciferol (VITAMIN D2) 1.25 MG (50000 UT) capsule, Take 1 capsule by  mouth Once a week, Disp: 4 capsule, Rfl: 2   ergocalciferol (VITAMIN D2) 1.25 MG (50000 UT) capsule, Take 1 capsule (50,000 Units total) by mouth once a week., Disp: 4 capsule, Rfl: 2   ergocalciferol (VITAMIN D2) 1.25 MG (50000 UT) capsule, Take 1 capsule (50,000 Units total) by mouth once a week., Disp: 4 capsule, Rfl: 2   Insulin Pen Needle 31G X 8 MM MISC, Use as directed, Disp: 100 each, Rfl: 3   lamoTRIgine (LAMICTAL) 150 MG tablet, Take 1 tablet (150 mg total) by mouth daily., Disp: 90 tablet, Rfl: 1   metFORMIN (GLUCOPHAGE) 500 MG tablet, TAKE 1 TABLET (500 MG TOTAL) BY MOUTH 2 (TWO) TIMES DAILY WITH A MEAL. NO RF UNTIL OV, Disp: 120 tablet, Rfl: 0   olmesartan-hydrochlorothiazide (BENICAR HCT) 40-12.5 MG tablet, TAKE 1 TABLET BY MOUTH EVERY DAY IN THE MORNING, Disp: 90 tablet, Rfl: 3   olmesartan-hydrochlorothiazide (BENICAR HCT) 40-25 MG tablet, Take 1 tablet by mouth daily., Disp: 30 tablet, Rfl: 0   olmesartan-hydrochlorothiazide (BENICAR HCT) 40-25 MG tablet, Take 1 tablet by mouth daily., Disp: 90 tablet, Rfl: 1   rosuvastatin (CRESTOR) 10 MG tablet, TAKE 1 TABLET BY MOUTH EVERY DAY, Disp: 30 tablet, Rfl: 0   Semaglutide-Weight Management (WEGOVY) 2.4 MG/0.75ML SOAJ, Inject 2.4 mg into the skin once a week., Disp: 3 mL, Rfl: 1   Semaglutide-Weight Management (WEGOVY) 2.4 MG/0.75ML SOAJ, Inject 2.4 mg into the skin every 7 (seven) days., Disp: 3 mL, Rfl: 0   Semaglutide-Weight Management (  WEGOVY) 2.4 MG/0.75ML SOAJ, Inject 2.4mg  under the skin once weekly, Disp: 9 mL, Rfl: 1   Semaglutide-Weight Management (WEGOVY) 2.4 MG/0.75ML SOAJ, Inject 2.4 mg into the skin once a week., Disp: 9 mL, Rfl: 1   Semaglutide-Weight Management (WEGOVY) 2.4 MG/0.75ML SOAJ, Inject 2.4 mg into the skin once a week., Disp: 9 mL, Rfl: 1   vitamin B-12 (CYANOCOBALAMIN) 1000 MCG tablet, Take 1 tablet (1,000 mcg total) by mouth daily., Disp:  , Rfl:    Vitamin D, Ergocalciferol, (DRISDOL) 1.25 MG (50000 UNIT)  CAPS capsule, Take 1 capsule (50,000 Units total) by mouth every 7 (seven) days., Disp: 12 capsule, Rfl: 1   Vitamin D, Ergocalciferol, (DRISDOL) 1.25 MG (50000 UNIT) CAPS capsule, Take 1 capsule (50,000 Units total) by mouth once a week., Disp: 4 capsule, Rfl: 2   Vitamin D, Ergocalciferol, (DRISDOL) 1.25 MG (50000 UNIT) CAPS capsule, Take 1 capsule (50,000 Units total) by mouth once a week., Disp: 4 capsule, Rfl: 2   Vitamin D, Ergocalciferol, (DRISDOL) 1.25 MG (50000 UNIT) CAPS capsule, Take 1 capsule (50,000 Units total) by mouth once a week., Disp: 4 capsule, Rfl: 2 Medication Side Effects: none  Family Medical/ Social History: Changes? No  MENTAL HEALTH EXAM:  There were no vitals taken for this visit.There is no height or weight on file to calculate BMI.  General Appearance: Casual, Neat, and Well Groomed  Eye Contact:  Good  Speech:  Clear and Coherent  Volume:  Normal  Mood:  Anxious, Depressed, and Dysphoric  Affect:  Depressed and Anxious  Thought Process:  Coherent  Orientation:  Full (Time, Place, and Person)  Thought Content: Logical   Suicidal Thoughts:  No  Homicidal Thoughts:  No  Memory:  WNL  Judgement:  Good  Insight:  Good  Psychomotor Activity:  Normal  Concentration:  Concentration: Good  Recall:  Good  Fund of Knowledge: Good  Language: Good  Assets:  Desire for Improvement  ADL's:  Intact  Cognition: WNL  Prognosis:  Good    DIAGNOSES:    ICD-10-CM   1. Bipolar II disorder (HCC)  F31.81 lamoTRIgine (LAMICTAL) 150 MG tablet    2. Generalized anxiety disorder  F41.1 lamoTRIgine (LAMICTAL) 150 MG tablet      Receiving Psychotherapy: No    RECOMMENDATIONS:   Greater than 50% of 30 min. face to face time with patient was spent on counseling and coordination of care. We discussed his exacerbation of anxiety and mood fluctuations over the summer. 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 While I was out on vacation, pt needed an emergent appt and was seen by Melony Overly in my absence. He is reporting some improvement since last visit when his Lamictal was increased. We also talked about his diminished sleep quality that he reports has been occurring for years. I recommended he talk to his PCP about need for sleep study. He is currently Obese and this could be a factor.      Continue  Lamictal 150 mg daily Continue on Wellbutrin 300 mg daily Continue Xanax 0.5 mg twice daily only for sever anxiety or panic To start Trazodone 50 mg at bedtime for sleep Will follow up in 4 weeks to reassess To report worsening symptoms promptly Provided emergency contact information Will report any worsening symptoms or changes promptly Reviewed PDMP         Joan Flores, NP

## 2023-05-26 DIAGNOSIS — F411 Generalized anxiety disorder: Secondary | ICD-10-CM | POA: Diagnosis not present

## 2023-06-07 DIAGNOSIS — F411 Generalized anxiety disorder: Secondary | ICD-10-CM | POA: Diagnosis not present

## 2023-06-15 ENCOUNTER — Other Ambulatory Visit (HOSPITAL_COMMUNITY): Payer: Self-pay

## 2023-06-16 ENCOUNTER — Ambulatory Visit: Payer: BC Managed Care – PPO | Admitting: Behavioral Health

## 2023-06-16 ENCOUNTER — Encounter: Payer: Self-pay | Admitting: Behavioral Health

## 2023-06-16 ENCOUNTER — Other Ambulatory Visit (HOSPITAL_COMMUNITY): Payer: Self-pay

## 2023-06-16 DIAGNOSIS — F3181 Bipolar II disorder: Secondary | ICD-10-CM | POA: Diagnosis not present

## 2023-06-16 DIAGNOSIS — F411 Generalized anxiety disorder: Secondary | ICD-10-CM

## 2023-06-16 DIAGNOSIS — G4700 Insomnia, unspecified: Secondary | ICD-10-CM | POA: Diagnosis not present

## 2023-06-16 MED ORDER — TRAZODONE HCL 50 MG PO TABS
50.0000 mg | ORAL_TABLET | Freq: Every day | ORAL | 1 refills | Status: DC
Start: 2023-06-16 — End: 2023-07-10

## 2023-06-16 NOTE — Progress Notes (Signed)
[facility]  Patient ID: Corey Martin,  MRN: [id number]  PCP: Charlane Ferretti, DO  Date of Evaluation: 06/16/2023 Time spent:30 minutes  Chief Complaint:  Chief Complaint   Anxiety; Depression; Follow-up; Patient Education; Medication Refill     HISTORY/CURRENT STATUS: HPI  47 year old presents to this office for follow up and for medication management.  Anxiety and depression have improved since last visit. He is doing well and does not want to make any medication adjustments this visit. Less racing thoughts and irritability.  Says his situational stressors are calming which is also helping overall.   Says his anxiety level today is 4/10 and depression at 4/10. Says he is sleeping 7-8 hours per night. No mania, No psychosis, No SI/HI. He will continue in psychotherapy. Requesting 6 mo f/u.       Individual Medical History/ Review of Systems: Changes? :No   Allergies: Patient has no known allergies.  Current Medications:  Current Outpatient Medications:    traZODone (DESYREL) 50 MG tablet, Take 1 tablet (50 mg total) by mouth at bedtime., Disp: 30 tablet, Rfl: 1   ALPRAZolam (XANAX) 0.5 MG tablet, Take 0.5-1 tablets (0.25-0.5 mg total) by mouth 2 (two) times daily as needed for anxiety., Disp: 60 tablet, Rfl: 0   buPROPion (WELLBUTRIN XL) 300 MG 24 hr tablet, Take 1 tablet (300 mg total) by mouth daily. Needs office visit, Disp: 90 tablet, Rfl: 1   Choline Fenofibrate (FENOFIBRIC ACID) 135 MG CPDR, TAKE 1 CAPSULE BY MOUTH EVERY DAY, Disp: 90 capsule, Rfl: 1   ergocalciferol (VITAMIN D2) 1.25 MG (50000 UT) capsule, Take 1 capsule by mouth Once a week, Disp: 4 capsule, Rfl: 2   ergocalciferol (VITAMIN D2) 1.25 MG (50000 UT) capsule, Take 1 capsule (50,000 Units total) by mouth once a week., Disp: 4 capsule, Rfl: 2   ergocalciferol (VITAMIN D2) 1.25 MG (50000 UT) capsule, Take 1 capsule (50,000 Units total) by mouth once a week., Disp: 4 capsule, Rfl: 2   Insulin Pen  Needle 31G X 8 MM MISC, Use as directed, Disp: 100 each, Rfl: 3   lamoTRIgine (LAMICTAL) 150 MG tablet, Take 1 tablet (150 mg total) by mouth daily., Disp: 90 tablet, Rfl: 1   metFORMIN (GLUCOPHAGE) 500 MG tablet, TAKE 1 TABLET (500 MG TOTAL) BY MOUTH 2 (TWO) TIMES DAILY WITH A MEAL. NO RF UNTIL OV, Disp: 120 tablet, Rfl: 0   olmesartan-hydrochlorothiazide (BENICAR HCT) 40-12.5 MG tablet, TAKE 1 TABLET BY MOUTH EVERY DAY IN THE MORNING, Disp: 90 tablet, Rfl: 3   olmesartan-hydrochlorothiazide (BENICAR HCT) 40-25 MG tablet, Take 1 tablet by mouth daily., Disp: 30 tablet, Rfl: 0   olmesartan-hydrochlorothiazide (BENICAR HCT) 40-25 MG tablet, Take 1 tablet by mouth daily., Disp: 90 tablet, Rfl: 1   rosuvastatin (CRESTOR) 10 MG tablet, TAKE 1 TABLET BY MOUTH EVERY DAY, Disp: 30 tablet, Rfl: 0   Semaglutide-Weight Management (WEGOVY) 2.4 MG/0.75ML SOAJ, Inject 2.4 mg into the skin once a week., Disp: 3 mL, Rfl: 1   Semaglutide-Weight Management (WEGOVY) 2.4 MG/0.75ML SOAJ, Inject 2.4 mg into the skin every 7 (seven) days., Disp: 3 mL, Rfl: 0   Semaglutide-Weight Management (WEGOVY) 2.4 MG/0.75ML SOAJ, Inject 2.4mg  under the skin once weekly, Disp: 9 mL, Rfl: 1   Semaglutide-Weight Management (WEGOVY) 2.4 MG/0.75ML SOAJ, Inject 2.4 mg into the skin once a week., Disp: 9 mL, Rfl: 1   Semaglutide-Weight Management (WEGOVY) 2.4 MG/0.75ML SOAJ, Inject 2.4 mg into the skin once a week., Disp: 9 mL,  Rfl: 1   vitamin B-12 (CYANOCOBALAMIN) 1000 MCG tablet, Take 1 tablet (1,000 mcg total) by mouth daily., Disp:  , Rfl:    Vitamin D, Ergocalciferol, (DRISDOL) 1.25 MG (50000 UNIT) CAPS capsule, Take 1 capsule (50,000 Units total) by mouth every 7 (seven) days., Disp: 12 capsule, Rfl: 1   Vitamin D, Ergocalciferol, (DRISDOL) 1.25 MG (50000 UNIT) CAPS capsule, Take 1 capsule (50,000 Units total) by mouth once a week., Disp: 4 capsule, Rfl: 2   Vitamin D, Ergocalciferol, (DRISDOL) 1.25 MG (50000 UNIT) CAPS capsule, Take  1 capsule (50,000 Units total) by mouth once a week., Disp: 4 capsule, Rfl: 2   Vitamin D, Ergocalciferol, (DRISDOL) 1.25 MG (50000 UNIT) CAPS capsule, Take 1 capsule (50,000 Units total) by mouth once a week., Disp: 4 capsule, Rfl: 2 Medication Side Effects: none  Family Medical/ Social History: Changes? No  MENTAL HEALTH EXAM:  There were no vitals taken for this visit.There is no height or weight on file to calculate BMI.  General Appearance: Casual, Neat, and Well Groomed  Eye Contact:  Good  Speech:  Clear and Coherent  Volume:  Normal  Mood:  NA  Affect:  Appropriate  Thought Process:  Coherent  Orientation:  Full (Time, Place, and Person)  Thought Content: Logical   Suicidal Thoughts:  No  Homicidal Thoughts:  No  Memory:  WNL  Judgement:  Good  Insight:  Good  Psychomotor Activity:  Normal  Concentration:  Concentration: Good  Recall:  Good  Fund of Knowledge: Good  Language: Good  Assets:  Desire for Improvement  ADL's:  Intact  Cognition: WNL  Prognosis:  Good    DIAGNOSES:    ICD-10-CM   1. Insomnia, unspecified type  G47.00 traZODone (DESYREL) 50 MG tablet      Receiving Psychotherapy: No    RECOMMENDATIONS:   Greater than 50% of 30 min. face to face time with patient was spent on counseling and coordination of care. We talked about his improvement with anxiety and depression since last visit. Also discussed family dynamics and complexities with spouse he is working through.    We agreed today:  Continue  Lamictal 150 mg daily Continue on Wellbutrin 300 mg daily Continue Xanax 0.5 mg twice daily only for sever anxiety or panic To start Trazodone 50 mg at bedtime for sleep Will follow up in 12  weeks to reassess To report worsening symptoms promptly Provided emergency contact information Will report any worsening symptoms or changes promptly Reviewed PDMP      Joan Flores, NP

## 2023-06-20 DIAGNOSIS — F411 Generalized anxiety disorder: Secondary | ICD-10-CM | POA: Diagnosis not present

## 2023-07-04 ENCOUNTER — Other Ambulatory Visit (HOSPITAL_COMMUNITY): Payer: Self-pay

## 2023-07-04 DIAGNOSIS — G4733 Obstructive sleep apnea (adult) (pediatric): Secondary | ICD-10-CM | POA: Diagnosis not present

## 2023-07-04 DIAGNOSIS — E559 Vitamin D deficiency, unspecified: Secondary | ICD-10-CM | POA: Diagnosis not present

## 2023-07-04 DIAGNOSIS — E88819 Insulin resistance, unspecified: Secondary | ICD-10-CM | POA: Diagnosis not present

## 2023-07-04 DIAGNOSIS — I10 Essential (primary) hypertension: Secondary | ICD-10-CM | POA: Diagnosis not present

## 2023-07-04 MED ORDER — VITAMIN D (ERGOCALCIFEROL) 1.25 MG (50000 UNIT) PO CAPS
50000.0000 [IU] | ORAL_CAPSULE | ORAL | 2 refills | Status: AC
  Filled 2023-07-04 – 2023-08-04 (×2): qty 4, 28d supply, fill #0
  Filled 2023-09-30 – 2023-10-11 (×2): qty 4, 28d supply, fill #1
  Filled 2024-05-25: qty 4, 28d supply, fill #2

## 2023-07-04 MED ORDER — WEGOVY 2.4 MG/0.75ML ~~LOC~~ SOAJ
2.4000 mg | SUBCUTANEOUS | 1 refills | Status: AC
  Filled 2023-07-04 – 2023-08-04 (×2): qty 9, 84d supply, fill #0

## 2023-07-07 DIAGNOSIS — F411 Generalized anxiety disorder: Secondary | ICD-10-CM | POA: Diagnosis not present

## 2023-07-09 ENCOUNTER — Other Ambulatory Visit: Payer: Self-pay | Admitting: Behavioral Health

## 2023-07-09 DIAGNOSIS — G4700 Insomnia, unspecified: Secondary | ICD-10-CM

## 2023-07-14 ENCOUNTER — Other Ambulatory Visit (HOSPITAL_COMMUNITY): Payer: Self-pay

## 2023-07-18 DIAGNOSIS — I10 Essential (primary) hypertension: Secondary | ICD-10-CM | POA: Diagnosis not present

## 2023-07-18 DIAGNOSIS — R0981 Nasal congestion: Secondary | ICD-10-CM | POA: Diagnosis not present

## 2023-07-18 DIAGNOSIS — R051 Acute cough: Secondary | ICD-10-CM | POA: Diagnosis not present

## 2023-08-01 DIAGNOSIS — F411 Generalized anxiety disorder: Secondary | ICD-10-CM | POA: Diagnosis not present

## 2023-08-04 ENCOUNTER — Other Ambulatory Visit (HOSPITAL_COMMUNITY): Payer: Self-pay

## 2023-08-04 ENCOUNTER — Other Ambulatory Visit: Payer: Self-pay

## 2023-08-04 MED ORDER — OLMESARTAN MEDOXOMIL-HCTZ 40-25 MG PO TABS
1.0000 | ORAL_TABLET | Freq: Every day | ORAL | 1 refills | Status: AC
  Filled 2023-08-04: qty 90, 90d supply, fill #0
  Filled 2024-01-23: qty 90, 90d supply, fill #1

## 2023-08-05 DIAGNOSIS — E785 Hyperlipidemia, unspecified: Secondary | ICD-10-CM | POA: Diagnosis not present

## 2023-08-05 DIAGNOSIS — E559 Vitamin D deficiency, unspecified: Secondary | ICD-10-CM | POA: Diagnosis not present

## 2023-08-05 DIAGNOSIS — Z125 Encounter for screening for malignant neoplasm of prostate: Secondary | ICD-10-CM | POA: Diagnosis not present

## 2023-08-05 DIAGNOSIS — E538 Deficiency of other specified B group vitamins: Secondary | ICD-10-CM | POA: Diagnosis not present

## 2023-08-08 ENCOUNTER — Other Ambulatory Visit (HOSPITAL_COMMUNITY): Payer: Self-pay

## 2023-08-12 DIAGNOSIS — I10 Essential (primary) hypertension: Secondary | ICD-10-CM | POA: Diagnosis not present

## 2023-08-12 DIAGNOSIS — Z1331 Encounter for screening for depression: Secondary | ICD-10-CM | POA: Diagnosis not present

## 2023-08-12 DIAGNOSIS — Z0000 Encounter for general adult medical examination without abnormal findings: Secondary | ICD-10-CM | POA: Diagnosis not present

## 2023-08-12 DIAGNOSIS — Z1339 Encounter for screening examination for other mental health and behavioral disorders: Secondary | ICD-10-CM | POA: Diagnosis not present

## 2023-08-16 ENCOUNTER — Other Ambulatory Visit (HOSPITAL_COMMUNITY): Payer: Self-pay

## 2023-08-16 DIAGNOSIS — G4733 Obstructive sleep apnea (adult) (pediatric): Secondary | ICD-10-CM | POA: Diagnosis not present

## 2023-08-16 DIAGNOSIS — E88819 Insulin resistance, unspecified: Secondary | ICD-10-CM | POA: Diagnosis not present

## 2023-08-16 DIAGNOSIS — E559 Vitamin D deficiency, unspecified: Secondary | ICD-10-CM | POA: Diagnosis not present

## 2023-08-16 DIAGNOSIS — I10 Essential (primary) hypertension: Secondary | ICD-10-CM | POA: Diagnosis not present

## 2023-08-16 MED ORDER — WEGOVY 2.4 MG/0.75ML ~~LOC~~ SOAJ: 2.4000 mg | SUBCUTANEOUS | 1 refills | Status: AC

## 2023-08-18 DIAGNOSIS — F411 Generalized anxiety disorder: Secondary | ICD-10-CM | POA: Diagnosis not present

## 2023-09-15 ENCOUNTER — Encounter: Payer: Self-pay | Admitting: Behavioral Health

## 2023-09-15 ENCOUNTER — Ambulatory Visit: Payer: BC Managed Care – PPO | Admitting: Behavioral Health

## 2023-09-15 DIAGNOSIS — F411 Generalized anxiety disorder: Secondary | ICD-10-CM | POA: Diagnosis not present

## 2023-09-15 DIAGNOSIS — G4700 Insomnia, unspecified: Secondary | ICD-10-CM | POA: Diagnosis not present

## 2023-09-15 DIAGNOSIS — F3181 Bipolar II disorder: Secondary | ICD-10-CM

## 2023-09-15 NOTE — Progress Notes (Addendum)
[facility]  Patient ID: Corey Martin,  MRN: [id number]  PCP: Charlane Ferretti, DO  Date of Evaluation: 09/15/2023 Time spent:30 minutes  Chief Complaint:  Chief Complaint   Anxiety; Depression; Follow-up; Patient Education     HISTORY/CURRENT STATUS: HPI 48 year old presents to this office for follow up and for medication management.  No changes this visit.  Anxiety and depression have improved since last visit. He is doing well and does not want to make any medication adjustments this visit. Less racing thoughts and irritability.  Says his situational stressors are calming which is also helping overall.   Says his anxiety level today is 2/10 and depression at 2/10. Says he is sleeping 7-8 hours per night. No mania, No psychosis, No SI/HI. He will continue in psychotherapy. Requesting 6 mo f/u.      Individual Medical History/ Review of Systems: Changes? :No   Allergies: Patient has no known allergies.  Current Medications:  Current Outpatient Medications:    ALPRAZolam (XANAX) 0.5 MG tablet, Take 0.5-1 tablets (0.25-0.5 mg total) by mouth 2 (two) times daily as needed for anxiety., Disp: 60 tablet, Rfl: 0   buPROPion (WELLBUTRIN XL) 300 MG 24 hr tablet, Take 1 tablet (300 mg total) by mouth daily. Needs office visit, Disp: 90 tablet, Rfl: 1   Choline Fenofibrate (FENOFIBRIC ACID) 135 MG CPDR, TAKE 1 CAPSULE BY MOUTH EVERY DAY, Disp: 90 capsule, Rfl: 1   ergocalciferol (VITAMIN D2) 1.25 MG (50000 UT) capsule, Take 1 capsule by mouth Once a week, Disp: 4 capsule, Rfl: 2   ergocalciferol (VITAMIN D2) 1.25 MG (50000 UT) capsule, Take 1 capsule (50,000 Units total) by mouth once a week., Disp: 4 capsule, Rfl: 2   ergocalciferol (VITAMIN D2) 1.25 MG (50000 UT) capsule, Take 1 capsule (50,000 Units total) by mouth once a week., Disp: 4 capsule, Rfl: 2   Insulin Pen Needle 31G X 8 MM MISC, Use as directed, Disp: 100 each, Rfl: 3   lamoTRIgine (LAMICTAL) 150 MG tablet, Take 1  tablet (150 mg total) by mouth daily., Disp: 90 tablet, Rfl: 1   metFORMIN (GLUCOPHAGE) 500 MG tablet, TAKE 1 TABLET (500 MG TOTAL) BY MOUTH 2 (TWO) TIMES DAILY WITH A MEAL. NO RF UNTIL OV, Disp: 120 tablet, Rfl: 0   olmesartan-hydrochlorothiazide (BENICAR HCT) 40-12.5 MG tablet, TAKE 1 TABLET BY MOUTH EVERY DAY IN THE MORNING, Disp: 90 tablet, Rfl: 3   olmesartan-hydrochlorothiazide (BENICAR HCT) 40-25 MG tablet, Take 1 tablet by mouth daily., Disp: 90 tablet, Rfl: 1   olmesartan-hydrochlorothiazide (BENICAR HCT) 40-25 MG tablet, Take 1 tablet by mouth daily., Disp: 90 tablet, Rfl: 1   rosuvastatin (CRESTOR) 10 MG tablet, TAKE 1 TABLET BY MOUTH EVERY DAY, Disp: 30 tablet, Rfl: 0   Semaglutide-Weight Management (WEGOVY) 2.4 MG/0.75ML SOAJ, Inject 2.4 mg into the skin once a week., Disp: 3 mL, Rfl: 1   Semaglutide-Weight Management (WEGOVY) 2.4 MG/0.75ML SOAJ, Inject 2.4 mg into the skin every 7 (seven) days., Disp: 3 mL, Rfl: 0   Semaglutide-Weight Management (WEGOVY) 2.4 MG/0.75ML SOAJ, Inject 2.4mg  under the skin once weekly, Disp: 9 mL, Rfl: 1   Semaglutide-Weight Management (WEGOVY) 2.4 MG/0.75ML SOAJ, Inject 2.4 mg into the skin once a week., Disp: 9 mL, Rfl: 1   Semaglutide-Weight Management (WEGOVY) 2.4 MG/0.75ML SOAJ, Inject 2.4 mg into the skin once a week., Disp: 9 mL, Rfl: 1   Semaglutide-Weight Management (WEGOVY) 2.4 MG/0.75ML SOAJ, Inject 2.4 mg into the skin once a week., Disp: 9  mL, Rfl: 1   Semaglutide-Weight Management (WEGOVY) 2.4 MG/0.75ML SOAJ, Inject 2.4 mg into the skin once a week., Disp: 9 mL, Rfl: 1   traZODone (DESYREL) 50 MG tablet, TAKE 1 TABLET BY MOUTH EVERYDAY AT BEDTIME, Disp: 90 tablet, Rfl: 0   vitamin B-12 (CYANOCOBALAMIN) 1000 MCG tablet, Take 1 tablet (1,000 mcg total) by mouth daily., Disp:  , Rfl:    Vitamin D, Ergocalciferol, (DRISDOL) 1.25 MG (50000 UNIT) CAPS capsule, Take 1 capsule (50,000 Units total) by mouth every 7 (seven) days., Disp: 12 capsule, Rfl:  1   Vitamin D, Ergocalciferol, (DRISDOL) 1.25 MG (50000 UNIT) CAPS capsule, Take 1 capsule (50,000 Units total) by mouth once a week., Disp: 4 capsule, Rfl: 2   Vitamin D, Ergocalciferol, (DRISDOL) 1.25 MG (50000 UNIT) CAPS capsule, Take 1 capsule (50,000 Units total) by mouth once a week., Disp: 4 capsule, Rfl: 2   Vitamin D, Ergocalciferol, (DRISDOL) 1.25 MG (50000 UNIT) CAPS capsule, Take 1 capsule (50,000 Units total) by mouth once a week., Disp: 4 capsule, Rfl: 2   Vitamin D, Ergocalciferol, (DRISDOL) 1.25 MG (50000 UNIT) CAPS capsule, Take 1 capsule (50,000 Units total) by mouth once a week., Disp: 4 capsule, Rfl: 2 Medication Side Effects: none  Family Medical/ Social History: Changes? No  MENTAL HEALTH EXAM:  There were no vitals taken for this visit.There is no height or weight on file to calculate BMI.  General Appearance: Casual, Neat, and Well Groomed  Eye Contact:  Good  Speech:  Clear and Coherent  Volume:  Normal  Mood:  Negative  Affect:  Appropriate  Thought Process:  Coherent  Orientation:  Full (Time, Place, and Person)  Thought Content: Logical   Suicidal Thoughts:  No  Homicidal Thoughts:  No  Memory:  WNL  Judgement:  Good  Insight:  Good  Psychomotor Activity:  Normal  Concentration:  Concentration: Good  Recall:  Good  Fund of Knowledge: Good  Language: Good  Assets:  Desire for Improvement  ADL's:  Intact  Cognition: WNL  Prognosis:  Good    DIAGNOSES:    ICD-10-CM   1. Bipolar II disorder (HCC)  F31.81     2. Insomnia, unspecified type  G47.00     3. Generalized anxiety disorder  F41.1       Receiving Psychotherapy: No    RECOMMENDATIONS:   Greater than 50% of 30 min. face to face time with patient was spent on counseling and coordination of care. No changes this visit. We talked about his improvement with anxiety and depression since last visit. Also discussed family dynamics and complexities with spouse he is working through.    We  agreed today:   Continue  Lamictal 150 mg daily Continue on Wellbutrin 300 mg daily Continue Xanax 0.5 mg twice daily only for sever anxiety or panic To start Trazodone 50 mg at bedtime for sleep Will follow up in 12  weeks to reassess To report worsening symptoms promptly Provided emergency contact information Will report any worsening symptoms or changes promptly Reviewed PDMP    Joan Flores, NP

## 2023-09-30 ENCOUNTER — Other Ambulatory Visit (HOSPITAL_COMMUNITY): Payer: Self-pay

## 2023-10-05 ENCOUNTER — Other Ambulatory Visit: Payer: Self-pay | Admitting: Behavioral Health

## 2023-10-05 DIAGNOSIS — G4700 Insomnia, unspecified: Secondary | ICD-10-CM

## 2023-10-11 ENCOUNTER — Other Ambulatory Visit (HOSPITAL_COMMUNITY): Payer: Self-pay

## 2023-10-13 ENCOUNTER — Other Ambulatory Visit (HOSPITAL_COMMUNITY): Payer: Self-pay

## 2023-10-13 DIAGNOSIS — E559 Vitamin D deficiency, unspecified: Secondary | ICD-10-CM | POA: Diagnosis not present

## 2023-10-13 DIAGNOSIS — G4733 Obstructive sleep apnea (adult) (pediatric): Secondary | ICD-10-CM | POA: Diagnosis not present

## 2023-10-13 DIAGNOSIS — I10 Essential (primary) hypertension: Secondary | ICD-10-CM | POA: Diagnosis not present

## 2023-10-13 DIAGNOSIS — E88819 Insulin resistance, unspecified: Secondary | ICD-10-CM | POA: Diagnosis not present

## 2023-10-13 MED ORDER — ZEPBOUND 10 MG/0.5ML ~~LOC~~ SOAJ
10.0000 mg | SUBCUTANEOUS | 1 refills | Status: AC
  Filled 2023-10-13 – 2024-05-25 (×2): qty 2, 28d supply, fill #0

## 2023-10-27 ENCOUNTER — Other Ambulatory Visit: Payer: Self-pay

## 2023-10-27 ENCOUNTER — Other Ambulatory Visit (HOSPITAL_COMMUNITY): Payer: Self-pay

## 2023-10-27 ENCOUNTER — Other Ambulatory Visit: Payer: Self-pay | Admitting: Behavioral Health

## 2023-10-27 ENCOUNTER — Other Ambulatory Visit: Payer: Self-pay | Admitting: Physician Assistant

## 2023-10-27 DIAGNOSIS — F3181 Bipolar II disorder: Secondary | ICD-10-CM

## 2023-10-27 DIAGNOSIS — F411 Generalized anxiety disorder: Secondary | ICD-10-CM

## 2023-10-27 DIAGNOSIS — F4323 Adjustment disorder with mixed anxiety and depressed mood: Secondary | ICD-10-CM

## 2023-10-27 MED ORDER — BISACODYL 5 MG PO TBEC
DELAYED_RELEASE_TABLET | ORAL | 0 refills | Status: AC
  Filled 2023-10-27: qty 4, 1d supply, fill #0

## 2023-10-27 MED ORDER — PEG 3350-KCL-NA BICARB-NACL 420 G PO SOLR
ORAL | 0 refills | Status: AC
  Filled 2023-10-27: qty 4000, 1d supply, fill #0

## 2023-10-28 ENCOUNTER — Other Ambulatory Visit (HOSPITAL_COMMUNITY): Payer: Self-pay

## 2023-10-28 MED ORDER — BUPROPION HCL ER (XL) 300 MG PO TB24
300.0000 mg | ORAL_TABLET | Freq: Every day | ORAL | 1 refills | Status: DC
  Filled 2023-10-28 – 2024-01-03 (×2): qty 90, 90d supply, fill #0

## 2023-10-29 ENCOUNTER — Other Ambulatory Visit (HOSPITAL_COMMUNITY): Payer: Self-pay

## 2023-10-29 MED ORDER — LAMOTRIGINE 150 MG PO TABS
150.0000 mg | ORAL_TABLET | Freq: Every day | ORAL | 1 refills | Status: DC
  Filled 2023-10-29 – 2024-01-03 (×2): qty 90, 90d supply, fill #0

## 2023-10-31 ENCOUNTER — Other Ambulatory Visit (HOSPITAL_COMMUNITY): Payer: Self-pay

## 2023-11-04 DIAGNOSIS — Z1211 Encounter for screening for malignant neoplasm of colon: Secondary | ICD-10-CM | POA: Diagnosis not present

## 2023-11-04 DIAGNOSIS — K5730 Diverticulosis of large intestine without perforation or abscess without bleeding: Secondary | ICD-10-CM | POA: Diagnosis not present

## 2023-11-11 ENCOUNTER — Other Ambulatory Visit (HOSPITAL_COMMUNITY): Payer: Self-pay

## 2023-11-14 ENCOUNTER — Other Ambulatory Visit (HOSPITAL_COMMUNITY): Payer: Self-pay

## 2023-11-14 ENCOUNTER — Other Ambulatory Visit: Payer: Self-pay

## 2023-11-14 MED ORDER — FENOFIBRIC ACID 135 MG PO CPDR
135.0000 mg | DELAYED_RELEASE_CAPSULE | Freq: Every day | ORAL | 3 refills | Status: DC
  Filled 2023-11-14 – 2023-11-15 (×3): qty 30, 30d supply, fill #0
  Filled 2024-01-03 (×2): qty 30, 30d supply, fill #1
  Filled 2024-02-14: qty 30, 30d supply, fill #2
  Filled 2024-04-04: qty 30, 30d supply, fill #3

## 2023-11-14 MED ORDER — ROSUVASTATIN CALCIUM 10 MG PO TABS
10.0000 mg | ORAL_TABLET | Freq: Every day | ORAL | 2 refills | Status: AC
  Filled 2023-11-14 – 2024-01-03 (×2): qty 90, 90d supply, fill #0
  Filled 2024-05-25: qty 90, 90d supply, fill #1

## 2023-11-14 MED ORDER — TRAZODONE HCL 50 MG PO TABS
50.0000 mg | ORAL_TABLET | Freq: Every evening | ORAL | 2 refills | Status: DC | PRN
  Filled 2023-11-14 – 2023-11-15 (×3): qty 90, 90d supply, fill #0

## 2023-11-14 MED ORDER — LAMOTRIGINE 100 MG PO TABS
100.0000 mg | ORAL_TABLET | Freq: Every day | ORAL | 3 refills | Status: DC
  Filled 2023-11-14 – 2023-11-15 (×3): qty 30, 30d supply, fill #0

## 2023-11-14 MED ORDER — OLMESARTAN MEDOXOMIL-HCTZ 40-25 MG PO TABS
1.0000 | ORAL_TABLET | Freq: Every day | ORAL | 2 refills | Status: AC
  Filled 2023-11-14: qty 90, 90d supply, fill #0
  Filled 2023-11-15: qty 60, 60d supply, fill #0
  Filled 2023-11-15: qty 30, 30d supply, fill #0
  Filled 2023-11-15: qty 90, 90d supply, fill #0
  Filled 2024-05-25: qty 90, 90d supply, fill #1

## 2023-11-14 MED ORDER — VITAMIN D (ERGOCALCIFEROL) 1.25 MG (50000 UNIT) PO CAPS
50000.0000 [IU] | ORAL_CAPSULE | ORAL | 3 refills | Status: AC
Start: 2023-11-14 — End: ?
  Filled 2023-11-14 (×3): qty 12, 84d supply, fill #0
  Filled 2023-11-15 (×2): qty 4, 28d supply, fill #0
  Filled 2024-01-03: qty 4, 28d supply, fill #1
  Filled 2024-01-23: qty 4, 28d supply, fill #2
  Filled 2024-03-12: qty 4, 28d supply, fill #3

## 2023-11-14 MED ORDER — WEGOVY 2.4 MG/0.75ML ~~LOC~~ SOAJ
2.4000 mg | SUBCUTANEOUS | 3 refills | Status: AC
  Filled 2023-11-14: qty 3, 28d supply, fill #0

## 2023-11-14 MED ORDER — VITAMIN D3 50 MCG (2000 UT) PO CAPS
2000.0000 [IU] | ORAL_CAPSULE | Freq: Every day | ORAL | 3 refills | Status: AC
  Filled 2023-11-14 – 2024-03-12 (×3): qty 30, 30d supply, fill #0

## 2023-11-14 MED ORDER — BUPROPION HCL ER (XL) 300 MG PO TB24
300.0000 mg | ORAL_TABLET | Freq: Every morning | ORAL | 2 refills | Status: DC
  Filled 2023-11-14 – 2024-03-08 (×2): qty 90, 90d supply, fill #0

## 2023-11-14 MED ORDER — METFORMIN HCL ER 500 MG PO TB24
500.0000 mg | ORAL_TABLET | Freq: Every day | ORAL | 2 refills | Status: AC
  Filled 2023-11-14 – 2023-11-15 (×3): qty 90, 90d supply, fill #0
  Filled 2024-03-08 – 2024-03-12 (×3): qty 90, 90d supply, fill #1

## 2023-11-15 ENCOUNTER — Other Ambulatory Visit (HOSPITAL_COMMUNITY): Payer: Self-pay

## 2023-11-15 ENCOUNTER — Other Ambulatory Visit: Payer: Self-pay

## 2023-11-15 DIAGNOSIS — F411 Generalized anxiety disorder: Secondary | ICD-10-CM | POA: Diagnosis not present

## 2023-11-17 ENCOUNTER — Other Ambulatory Visit (HOSPITAL_COMMUNITY): Payer: Self-pay

## 2023-11-25 ENCOUNTER — Other Ambulatory Visit (HOSPITAL_COMMUNITY): Payer: Self-pay

## 2023-12-08 ENCOUNTER — Other Ambulatory Visit (HOSPITAL_COMMUNITY): Payer: Self-pay

## 2023-12-08 DIAGNOSIS — E559 Vitamin D deficiency, unspecified: Secondary | ICD-10-CM | POA: Diagnosis not present

## 2023-12-08 DIAGNOSIS — I10 Essential (primary) hypertension: Secondary | ICD-10-CM | POA: Diagnosis not present

## 2023-12-08 DIAGNOSIS — G4733 Obstructive sleep apnea (adult) (pediatric): Secondary | ICD-10-CM | POA: Diagnosis not present

## 2023-12-08 DIAGNOSIS — E88819 Insulin resistance, unspecified: Secondary | ICD-10-CM | POA: Diagnosis not present

## 2023-12-08 MED ORDER — ZEPBOUND 10 MG/0.5ML ~~LOC~~ SOAJ
10.0000 mg | SUBCUTANEOUS | 1 refills | Status: AC
  Filled 2023-12-08: qty 2, 28d supply, fill #0

## 2023-12-15 DIAGNOSIS — F411 Generalized anxiety disorder: Secondary | ICD-10-CM | POA: Diagnosis not present

## 2023-12-27 ENCOUNTER — Other Ambulatory Visit: Payer: Self-pay | Admitting: Physician Assistant

## 2023-12-27 ENCOUNTER — Other Ambulatory Visit: Payer: Self-pay | Admitting: Behavioral Health

## 2023-12-27 DIAGNOSIS — F4323 Adjustment disorder with mixed anxiety and depressed mood: Secondary | ICD-10-CM

## 2023-12-27 DIAGNOSIS — F411 Generalized anxiety disorder: Secondary | ICD-10-CM

## 2023-12-27 DIAGNOSIS — F3181 Bipolar II disorder: Secondary | ICD-10-CM

## 2023-12-30 ENCOUNTER — Other Ambulatory Visit (HOSPITAL_COMMUNITY): Payer: Self-pay

## 2023-12-30 MED ORDER — ZEPBOUND 12.5 MG/0.5ML ~~LOC~~ SOAJ
12.5000 mg | SUBCUTANEOUS | 0 refills | Status: AC
  Filled 2023-12-30: qty 2, 28d supply, fill #0

## 2024-01-03 ENCOUNTER — Other Ambulatory Visit (HOSPITAL_COMMUNITY): Payer: Self-pay

## 2024-01-03 ENCOUNTER — Other Ambulatory Visit: Payer: Self-pay

## 2024-01-05 ENCOUNTER — Other Ambulatory Visit (HOSPITAL_COMMUNITY): Payer: Self-pay

## 2024-01-06 ENCOUNTER — Other Ambulatory Visit (HOSPITAL_COMMUNITY): Payer: Self-pay

## 2024-01-12 DIAGNOSIS — F411 Generalized anxiety disorder: Secondary | ICD-10-CM | POA: Diagnosis not present

## 2024-01-23 ENCOUNTER — Other Ambulatory Visit (HOSPITAL_COMMUNITY): Payer: Self-pay

## 2024-02-02 DIAGNOSIS — F411 Generalized anxiety disorder: Secondary | ICD-10-CM | POA: Diagnosis not present

## 2024-02-13 ENCOUNTER — Other Ambulatory Visit (HOSPITAL_COMMUNITY): Payer: Self-pay

## 2024-02-13 DIAGNOSIS — I10 Essential (primary) hypertension: Secondary | ICD-10-CM | POA: Diagnosis not present

## 2024-02-13 DIAGNOSIS — E559 Vitamin D deficiency, unspecified: Secondary | ICD-10-CM | POA: Diagnosis not present

## 2024-02-13 DIAGNOSIS — G4733 Obstructive sleep apnea (adult) (pediatric): Secondary | ICD-10-CM | POA: Diagnosis not present

## 2024-02-13 DIAGNOSIS — E88819 Insulin resistance, unspecified: Secondary | ICD-10-CM | POA: Diagnosis not present

## 2024-02-13 MED ORDER — ZEPBOUND 15 MG/0.5ML ~~LOC~~ SOAJ
15.0000 mg | SUBCUTANEOUS | 2 refills | Status: DC
  Filled 2024-02-13: qty 2, 28d supply, fill #0
  Filled 2024-03-08: qty 2, 28d supply, fill #1
  Filled 2024-03-08 – 2024-03-16 (×4): qty 2, 28d supply, fill #0
  Filled 2024-04-04 – 2024-04-09 (×2): qty 2, 28d supply, fill #1

## 2024-02-14 ENCOUNTER — Other Ambulatory Visit (HOSPITAL_COMMUNITY): Payer: Self-pay

## 2024-03-08 ENCOUNTER — Other Ambulatory Visit (HOSPITAL_COMMUNITY): Payer: Self-pay

## 2024-03-08 DIAGNOSIS — F411 Generalized anxiety disorder: Secondary | ICD-10-CM | POA: Diagnosis not present

## 2024-03-09 ENCOUNTER — Other Ambulatory Visit: Payer: Self-pay

## 2024-03-12 ENCOUNTER — Other Ambulatory Visit (HOSPITAL_COMMUNITY): Payer: Self-pay

## 2024-03-14 ENCOUNTER — Other Ambulatory Visit (HOSPITAL_COMMUNITY): Payer: Self-pay

## 2024-03-16 ENCOUNTER — Other Ambulatory Visit (HOSPITAL_COMMUNITY): Payer: Self-pay

## 2024-03-20 ENCOUNTER — Encounter: Payer: Self-pay | Admitting: Behavioral Health

## 2024-03-20 ENCOUNTER — Ambulatory Visit: Payer: BC Managed Care – PPO | Admitting: Behavioral Health

## 2024-03-20 ENCOUNTER — Other Ambulatory Visit (HOSPITAL_COMMUNITY): Payer: Self-pay

## 2024-03-20 DIAGNOSIS — G4700 Insomnia, unspecified: Secondary | ICD-10-CM

## 2024-03-20 DIAGNOSIS — F411 Generalized anxiety disorder: Secondary | ICD-10-CM

## 2024-03-20 DIAGNOSIS — F4323 Adjustment disorder with mixed anxiety and depressed mood: Secondary | ICD-10-CM

## 2024-03-20 DIAGNOSIS — F3181 Bipolar II disorder: Secondary | ICD-10-CM | POA: Diagnosis not present

## 2024-03-20 MED ORDER — BUPROPION HCL ER (XL) 300 MG PO TB24
300.0000 mg | ORAL_TABLET | Freq: Every morning | ORAL | 1 refills | Status: AC
  Filled 2024-03-20 – 2024-05-25 (×2): qty 90, 90d supply, fill #0

## 2024-03-20 MED ORDER — TRAZODONE HCL 50 MG PO TABS
50.0000 mg | ORAL_TABLET | Freq: Every evening | ORAL | 2 refills | Status: AC | PRN
  Filled 2024-03-20: qty 90, 90d supply, fill #0

## 2024-03-20 MED ORDER — ALPRAZOLAM 0.5 MG PO TABS
0.2500 mg | ORAL_TABLET | Freq: Two times a day (BID) | ORAL | 3 refills | Status: AC | PRN
  Filled 2024-03-20: qty 60, 30d supply, fill #0

## 2024-03-20 MED ORDER — LAMOTRIGINE 150 MG PO TABS
150.0000 mg | ORAL_TABLET | Freq: Every day | ORAL | 1 refills | Status: AC
  Filled 2024-03-20 – 2024-05-25 (×2): qty 90, 90d supply, fill #0

## 2024-03-20 NOTE — Progress Notes (Signed)
 [facility]  Patient ID: Corey Martin,  MRN: [id number]  PCP: Valentin Skates, DO  Date of Evaluation: 03/20/2024 Time spent:20 minutes  Chief Complaint:  Chief Complaint   Anxiety; Depression; Follow-up; Patient Education; Medication Refill     HISTORY/CURRENT STATUS: HPI 48 year old presents to this office for follow up and for medication management.  No changes this visit.  Anxiety and depression have improved since last visit. He is doing well and does not want to make any medication adjustments this visit. 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  Says his anxiety level today is 2/10 and depression at 2/10. Says he is sleeping 7-8 hours per night. No mania, No psychosis, No SI/HI. He will continue in psychotherapy. Requesting 6 mo f/u.  Individual Medical History/ Review of Systems: Changes? :No   Allergies: Patient has no known allergies.  Current Medications:  Current Outpatient Medications:    ALPRAZolam  (XANAX ) 0.5 MG tablet, Take 0.5-1 tablets (0.25-0.5 mg total) by mouth 2 (two) times daily as needed for anxiety., Disp: 60 tablet, Rfl: 3   bisacodyl  (DULCOLAX) 5 MG EC tablet, Take as directed., Disp: 4 tablet, Rfl: 0   buPROPion  (WELLBUTRIN  XL) 300 MG 24 hr tablet, Take 1 tablet (300 mg total) by mouth every morning., Disp: 90 tablet, Rfl: 1   Cholecalciferol  (VITAMIN D3) 50 MCG (2000 UT) capsule, Take 1 capsule (2,000 Units total) by mouth daily., Disp: 30 capsule, Rfl: 3   Choline Fenofibrate  (FENOFIBRIC ACID ) 135 MG CPDR, TAKE 1 CAPSULE BY MOUTH EVERY DAY, Disp: 90 capsule, Rfl: 1   Choline Fenofibrate  (FENOFIBRIC ACID ) 135 MG CPDR, Take 1 capsule (135 mg total)  by mouth daily., Disp: 30 capsule, Rfl: 3   ergocalciferol  (VITAMIN D2) 1.25 MG (50000 UT) capsule, Take 1 capsule by mouth Once a week, Disp: 4 capsule, Rfl: 2   ergocalciferol  (VITAMIN D2) 1.25 MG (50000 UT) capsule, Take 1  capsule (50,000 Units total) by mouth once a week., Disp: 4 capsule, Rfl: 2   ergocalciferol  (VITAMIN D2) 1.25 MG (50000 UT) capsule, Take 1 capsule (50,000 Units total) by mouth once a week., Disp: 4 capsule, Rfl: 2   Insulin  Pen Needle 31G X 8 MM MISC, Use as directed, Disp: 100 each, Rfl: 3   lamoTRIgine  (LAMICTAL ) 150 MG tablet, Take 1 tablet (150 mg total) by mouth daily., Disp: 90 tablet, Rfl: 1   metFORMIN  (GLUCOPHAGE ) 500 MG tablet, TAKE 1 TABLET (500 MG TOTAL) BY MOUTH 2 (TWO) TIMES DAILY WITH A MEAL. NO RF UNTIL OV, Disp: 120 tablet, Rfl: 0   metFORMIN  (GLUCOPHAGE -XR) 500 MG 24 hr tablet, Take 1 tablet (500 mg total) by mouth every evening with supper., Disp: 90 tablet, Rfl: 2   olmesartan -hydrochlorothiazide  (BENICAR  HCT) 40-12.5 MG tablet, TAKE 1 TABLET BY MOUTH EVERY DAY IN THE MORNING, Disp: 90 tablet, Rfl: 3   olmesartan -hydrochlorothiazide  (BENICAR  HCT) 40-25 MG tablet, Take 1 tablet by mouth daily., Disp: 90 tablet, Rfl: 1   olmesartan -hydrochlorothiazide  (BENICAR  HCT) 40-25 MG tablet, Take 1 tablet by mouth daily., Disp: 90 tablet, Rfl: 1   olmesartan -hydrochlorothiazide  (BENICAR  HCT) 40-25 MG tablet, Take 1 tablet by mouth daily., Disp: 90 tablet, Rfl: 2   polyethylene glycol-electrolytes (NULYTELY) 420 g solution, Use as directed., Disp: 4000 mL, Rfl: 0   rosuvastatin  (CRESTOR ) 10 MG tablet, Take 1 tablet (10 mg total) by mouth daily., Disp: 90 tablet, Rfl: 2   Semaglutide -Weight Management (WEGOVY ) 2.4  MG/0.75ML SOAJ, Inject 2.4 mg into the skin once a week., Disp: 3 mL, Rfl: 1   Semaglutide -Weight Management (WEGOVY ) 2.4 MG/0.75ML SOAJ, Inject 2.4 mg into the skin every 7 (seven) days., Disp: 3 mL, Rfl: 0   Semaglutide -Weight Management (WEGOVY ) 2.4 MG/0.75ML SOAJ, Inject 2.4mg  under the skin once weekly, Disp: 9 mL, Rfl: 1   Semaglutide -Weight Management (WEGOVY ) 2.4 MG/0.75ML SOAJ, Inject 2.4 mg into the skin once a week., Disp: 9 mL, Rfl: 1   Semaglutide -Weight Management  (WEGOVY ) 2.4 MG/0.75ML SOAJ, Inject 2.4 mg into the skin once a week., Disp: 9 mL, Rfl: 1   Semaglutide -Weight Management (WEGOVY ) 2.4 MG/0.75ML SOAJ, Inject 2.4 mg into the skin once a week., Disp: 9 mL, Rfl: 1   Semaglutide -Weight Management (WEGOVY ) 2.4 MG/0.75ML SOAJ, Inject 2.4 mg into the skin once a week., Disp: 9 mL, Rfl: 1   Semaglutide -Weight Management (WEGOVY ) 2.4 MG/0.75ML SOAJ, Inject 2.4 mg into the skin once a week., Disp: 3 mL, Rfl: 3   tirzepatide  (ZEPBOUND ) 10 MG/0.5ML Pen, Inject 10 mg into the skin once a week., Disp: 2 mL, Rfl: 1   tirzepatide  (ZEPBOUND ) 10 MG/0.5ML Pen, Inject 10 mg into the skin once a week., Disp: 2 mL, Rfl: 1   tirzepatide  (ZEPBOUND ) 12.5 MG/0.5ML Pen, Inject 12.5 mg into the skin once a week., Disp: 2 mL, Rfl: 0   tirzepatide  (ZEPBOUND ) 15 MG/0.5ML Pen, Inject 15 mg into the skin once a week., Disp: 2 mL, Rfl: 2   traZODone  (DESYREL ) 50 MG tablet, Take 1 tablet (50 mg total) by mouth at bedtime as needed., Disp: 90 tablet, Rfl: 2   vitamin B-12 (CYANOCOBALAMIN ) 1000 MCG tablet, Take 1 tablet (1,000 mcg total) by mouth daily., Disp:  , Rfl:    Vitamin D , Ergocalciferol , (DRISDOL ) 1.25 MG (50000 UNIT) CAPS capsule, Take 1 capsule (50,000 Units total) by mouth every 7 (seven) days., Disp: 12 capsule, Rfl: 1   Vitamin D , Ergocalciferol , (DRISDOL ) 1.25 MG (50000 UNIT) CAPS capsule, Take 1 capsule (50,000 Units total) by mouth once a week., Disp: 4 capsule, Rfl: 2   Vitamin D , Ergocalciferol , (DRISDOL ) 1.25 MG (50000 UNIT) CAPS capsule, Take 1 capsule (50,000 Units total) by mouth once a week., Disp: 4 capsule, Rfl: 2   Vitamin D , Ergocalciferol , (DRISDOL ) 1.25 MG (50000 UNIT) CAPS capsule, Take 1 capsule (50,000 Units total) by mouth once a week., Disp: 4 capsule, Rfl: 2   Vitamin D , Ergocalciferol , (DRISDOL ) 1.25 MG (50000 UNIT) CAPS capsule, Take 1 capsule (50,000 Units total) by mouth once a week., Disp: 4 capsule, Rfl: 2   Vitamin D , Ergocalciferol , (DRISDOL )  1.25 MG (50000 UNIT) CAPS capsule, Take 1 capsule (50,000 Units total) by mouth once a week or as directed for 28 days., Disp: 4 capsule, Rfl: 3 Medication Side Effects: none  Family Medical/ Social History: Changes? No  MENTAL HEALTH EXAM:  There were no vitals taken for this visit.There is no height or weight on file to calculate BMI.  General Appearance: Casual  Eye Contact:  Good  Speech:  Clear and Coherent  Volume:  Normal  Mood:  NA  Affect:  Appropriate  Thought Process:  Coherent  Orientation:  Full (Time, Place, and Person)  Thought Content: Logical   Suicidal Thoughts:  No  Homicidal Thoughts:  No  Memory:  WNL  Judgement:  Good  Insight:  Good  Psychomotor Activity:  Normal  Concentration:  Concentration: Good  Recall:  Good  Fund of Knowledge: Good  Language: Good  Assets:  Desire for Improvement  ADL's:  Intact  Cognition: WNL  Prognosis:  Good    DIAGNOSES:    ICD-10-CM   1. Bipolar II disorder (HCC)  F31.81 ALPRAZolam  (XANAX ) 0.5 MG tablet    lamoTRIgine  (LAMICTAL ) 150 MG tablet    buPROPion  (WELLBUTRIN  XL) 300 MG 24 hr tablet    2. Generalized anxiety disorder  F41.1 ALPRAZolam  (XANAX ) 0.5 MG tablet    lamoTRIgine  (LAMICTAL ) 150 MG tablet    buPROPion  (WELLBUTRIN  XL) 300 MG 24 hr tablet    3. Insomnia, unspecified type  G47.00 ALPRAZolam  (XANAX ) 0.5 MG tablet    traZODone  (DESYREL ) 50 MG tablet    4. Mood D/o   F43.23       Receiving Psychotherapy: No    RECOMMENDATIONS:   Greater than 50% of 30 min. face to face time with patient was spent on counseling and coordination of care. No changes this visit. We talked about his continued stability with anxiety and depression since last visit.  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 He continues in psychotherapy.   We agreed today:    Continue  Lamictal  150 mg daily Continue on Wellbutrin  300 mg daily Continue Xanax  0.5 mg twice daily only for sever  anxiety or panic To start Trazodone  50 mg at bedtime for sleep Will follow up in 6 months to reassess To report worsening symptoms promptly Provided emergency contact information Will report any worsening symptoms or changes promptly Reviewed PDMP       Redell DELENA Pizza, NP

## 2024-03-30 ENCOUNTER — Other Ambulatory Visit (HOSPITAL_COMMUNITY): Payer: Self-pay

## 2024-04-02 DIAGNOSIS — R6889 Other general symptoms and signs: Secondary | ICD-10-CM | POA: Diagnosis not present

## 2024-04-02 DIAGNOSIS — E785 Hyperlipidemia, unspecified: Secondary | ICD-10-CM | POA: Diagnosis not present

## 2024-04-02 DIAGNOSIS — Z1389 Encounter for screening for other disorder: Secondary | ICD-10-CM | POA: Diagnosis not present

## 2024-04-02 DIAGNOSIS — E559 Vitamin D deficiency, unspecified: Secondary | ICD-10-CM | POA: Diagnosis not present

## 2024-04-04 ENCOUNTER — Other Ambulatory Visit (HOSPITAL_COMMUNITY): Payer: Self-pay

## 2024-04-04 ENCOUNTER — Other Ambulatory Visit: Payer: Self-pay

## 2024-04-04 DIAGNOSIS — E559 Vitamin D deficiency, unspecified: Secondary | ICD-10-CM | POA: Diagnosis not present

## 2024-04-12 DIAGNOSIS — F411 Generalized anxiety disorder: Secondary | ICD-10-CM | POA: Diagnosis not present

## 2024-04-23 DIAGNOSIS — I10 Essential (primary) hypertension: Secondary | ICD-10-CM | POA: Diagnosis not present

## 2024-04-23 DIAGNOSIS — G4733 Obstructive sleep apnea (adult) (pediatric): Secondary | ICD-10-CM | POA: Diagnosis not present

## 2024-04-23 DIAGNOSIS — E559 Vitamin D deficiency, unspecified: Secondary | ICD-10-CM | POA: Diagnosis not present

## 2024-04-23 DIAGNOSIS — E88819 Insulin resistance, unspecified: Secondary | ICD-10-CM | POA: Diagnosis not present

## 2024-04-30 DIAGNOSIS — F411 Generalized anxiety disorder: Secondary | ICD-10-CM | POA: Diagnosis not present

## 2024-05-22 DIAGNOSIS — F411 Generalized anxiety disorder: Secondary | ICD-10-CM | POA: Diagnosis not present

## 2024-05-23 ENCOUNTER — Other Ambulatory Visit: Payer: Self-pay

## 2024-05-24 ENCOUNTER — Other Ambulatory Visit (HOSPITAL_COMMUNITY): Payer: Self-pay

## 2024-05-24 ENCOUNTER — Other Ambulatory Visit: Payer: Self-pay

## 2024-05-24 MED ORDER — FENOFIBRIC ACID 135 MG PO CPDR
135.0000 mg | DELAYED_RELEASE_CAPSULE | Freq: Every day | ORAL | 3 refills | Status: AC
  Filled 2024-05-24 – 2024-06-01 (×3): qty 90, 90d supply, fill #0

## 2024-05-24 MED ORDER — FENOFIBRIC ACID 135 MG PO CPDR
135.0000 mg | DELAYED_RELEASE_CAPSULE | Freq: Every day | ORAL | 3 refills | Status: DC
  Filled 2024-05-24: qty 90, 90d supply, fill #0

## 2024-05-24 MED ORDER — FENOFIBRIC ACID 135 MG PO CPDR: 135.0000 mg | DELAYED_RELEASE_CAPSULE | Freq: Every day | ORAL | 3 refills | Status: AC

## 2024-05-25 ENCOUNTER — Other Ambulatory Visit: Payer: Self-pay

## 2024-05-25 ENCOUNTER — Other Ambulatory Visit (HOSPITAL_COMMUNITY): Payer: Self-pay

## 2024-05-26 ENCOUNTER — Other Ambulatory Visit (HOSPITAL_COMMUNITY): Payer: Self-pay

## 2024-05-28 ENCOUNTER — Other Ambulatory Visit (HOSPITAL_COMMUNITY): Payer: Self-pay

## 2024-05-28 MED ORDER — TIRZEPATIDE-WEIGHT MANAGEMENT 15 MG/0.5ML ~~LOC~~ SOAJ
15.0000 mg | SUBCUTANEOUS | 1 refills | Status: AC
  Filled 2024-05-28 – 2024-08-29 (×2): qty 2, 28d supply, fill #0

## 2024-06-01 ENCOUNTER — Other Ambulatory Visit: Payer: Self-pay

## 2024-06-01 ENCOUNTER — Other Ambulatory Visit (HOSPITAL_COMMUNITY): Payer: Self-pay

## 2024-06-05 ENCOUNTER — Other Ambulatory Visit (HOSPITAL_COMMUNITY): Payer: Self-pay

## 2024-06-05 MED ORDER — ZEPBOUND 15 MG/0.5ML ~~LOC~~ SOAJ
15.0000 mg | SUBCUTANEOUS | 1 refills | Status: AC
  Filled 2024-06-05: qty 2, 28d supply, fill #0

## 2024-06-11 DIAGNOSIS — F411 Generalized anxiety disorder: Secondary | ICD-10-CM | POA: Diagnosis not present

## 2024-06-12 ENCOUNTER — Other Ambulatory Visit (HOSPITAL_COMMUNITY): Payer: Self-pay

## 2024-06-12 DIAGNOSIS — I10 Essential (primary) hypertension: Secondary | ICD-10-CM | POA: Diagnosis not present

## 2024-06-12 DIAGNOSIS — E559 Vitamin D deficiency, unspecified: Secondary | ICD-10-CM | POA: Diagnosis not present

## 2024-06-12 DIAGNOSIS — G4733 Obstructive sleep apnea (adult) (pediatric): Secondary | ICD-10-CM | POA: Diagnosis not present

## 2024-06-12 DIAGNOSIS — E88819 Insulin resistance, unspecified: Secondary | ICD-10-CM | POA: Diagnosis not present

## 2024-06-12 MED ORDER — ZEPBOUND 15 MG/0.5ML ~~LOC~~ SOAJ
15.0000 mg | SUBCUTANEOUS | 2 refills | Status: AC
  Filled 2024-06-12: qty 2, 28d supply, fill #0

## 2024-07-05 ENCOUNTER — Other Ambulatory Visit (HOSPITAL_COMMUNITY): Payer: Self-pay

## 2024-07-07 ENCOUNTER — Other Ambulatory Visit (HOSPITAL_COMMUNITY): Payer: Self-pay

## 2024-07-10 DIAGNOSIS — F411 Generalized anxiety disorder: Secondary | ICD-10-CM | POA: Diagnosis not present

## 2024-08-06 ENCOUNTER — Other Ambulatory Visit (HOSPITAL_COMMUNITY): Payer: Self-pay

## 2024-08-06 ENCOUNTER — Other Ambulatory Visit (HOSPITAL_BASED_OUTPATIENT_CLINIC_OR_DEPARTMENT_OTHER): Payer: Self-pay

## 2024-08-06 MED ORDER — TIRZEPATIDE-WEIGHT MANAGEMENT 15 MG/0.5ML ~~LOC~~ SOAJ
15.0000 mg | SUBCUTANEOUS | 2 refills | Status: AC
  Filled 2024-08-06 (×2): qty 2, 28d supply, fill #0

## 2024-08-29 ENCOUNTER — Other Ambulatory Visit (HOSPITAL_COMMUNITY): Payer: Self-pay

## 2024-09-18 ENCOUNTER — Ambulatory Visit: Admitting: Behavioral Health
